# Patient Record
Sex: Male | Born: 1985 | Race: Black or African American | Hispanic: No | Marital: Single | Smoking: Current every day smoker
Health system: Southern US, Community
[De-identification: ages and names within clinical notes are randomized; demographics above are authoritative.]

---
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
## 2006-11-09 ENCOUNTER — Emergency Department (HOSPITAL_COMMUNITY): Admission: EM | Admit: 2006-11-09 | Discharge: 2006-11-09 | Payer: Self-pay | Admitting: Emergency Medicine

## 2007-06-10 ENCOUNTER — Emergency Department (HOSPITAL_COMMUNITY): Admission: EM | Admit: 2007-06-10 | Discharge: 2007-06-10 | Payer: Self-pay | Admitting: Emergency Medicine

## 2008-08-30 ENCOUNTER — Emergency Department (HOSPITAL_COMMUNITY): Admission: EM | Admit: 2008-08-30 | Discharge: 2008-08-30 | Payer: Self-pay | Admitting: Emergency Medicine

## 2009-05-20 ENCOUNTER — Emergency Department (HOSPITAL_COMMUNITY): Admission: EM | Admit: 2009-05-20 | Discharge: 2009-05-20 | Payer: Self-pay | Admitting: Emergency Medicine

## 2010-05-21 ENCOUNTER — Emergency Department (HOSPITAL_COMMUNITY): Admission: EM | Admit: 2010-05-21 | Discharge: 2010-05-21 | Payer: Self-pay | Admitting: Emergency Medicine

## 2010-07-05 ENCOUNTER — Emergency Department (HOSPITAL_COMMUNITY): Admission: EM | Admit: 2010-07-05 | Discharge: 2010-07-05 | Payer: Self-pay | Admitting: Emergency Medicine

## 2011-01-14 LAB — GLUCOSE, CAPILLARY: Glucose-Capillary: 115 mg/dL — ABNORMAL HIGH (ref 70–99)

## 2011-07-11 LAB — DIFFERENTIAL
Basophils Relative: 0
Eosinophils Absolute: 0.1
Eosinophils Relative: 2
Lymphs Abs: 1.2
Neutrophils Relative %: 65

## 2011-07-11 LAB — RPR: RPR Ser Ql: REACTIVE — AB

## 2011-07-11 LAB — CBC
Hemoglobin: 15.5
MCHC: 32.9
RBC: 5.18
WBC: 5.1

## 2011-07-11 LAB — COMPREHENSIVE METABOLIC PANEL
ALT: 21
AST: 18
Alkaline Phosphatase: 78
CO2: 28
Calcium: 9.3
Chloride: 106
GFR calc Af Amer: >60
GFR calc non Af Amer: >60
Glucose, Bld: 88
Potassium: 4.3
Sodium: 139

## 2011-07-11 LAB — RPR TITER: RPR Titer: 1:16 {titer} — AB

## 2011-07-11 LAB — URINALYSIS, ROUTINE W REFLEX MICROSCOPIC
Bilirubin Urine: NEGATIVE
Glucose, UA: NEGATIVE
Nitrite: NEGATIVE
Specific Gravity, Urine: 1.019
pH: 6

## 2011-07-11 LAB — GC/CHLAMYDIA PROBE AMP, GENITAL: Chlamydia, DNA Probe: NEGATIVE

## 2011-07-21 LAB — DIFFERENTIAL
Basophils Absolute: 0.1
Basophils Relative: 1
Eosinophils Absolute: 0
Neutrophils Relative %: 62

## 2011-07-21 LAB — CBC
HCT: 45.5
Hemoglobin: 15.3
RBC: 5.05

## 2011-07-21 LAB — COMPREHENSIVE METABOLIC PANEL
ALT: 22
Alkaline Phosphatase: 47
BUN: 5 — ABNORMAL LOW
CO2: 29
GFR calc non Af Amer: >60
Glucose, Bld: 75
Potassium: 4.3
Sodium: 142
Total Bilirubin: 1.3 — ABNORMAL HIGH

## 2011-07-21 LAB — LIPASE, BLOOD: Lipase: 23

## 2012-03-26 ENCOUNTER — Emergency Department (HOSPITAL_COMMUNITY): Payer: Self-pay

## 2012-03-26 ENCOUNTER — Emergency Department (HOSPITAL_COMMUNITY)
Admission: EM | Admit: 2012-03-26 | Discharge: 2012-03-26 | Disposition: A | Discharge status: Home / Self Care | Payer: Self-pay | Attending: Emergency Medicine | Admitting: Emergency Medicine

## 2012-03-26 ENCOUNTER — Encounter (HOSPITAL_COMMUNITY): Payer: Self-pay | Admitting: Emergency Medicine

## 2012-03-26 DIAGNOSIS — M25569 Pain in unspecified knee: Secondary | ICD-10-CM | POA: Insufficient documentation

## 2012-03-26 DIAGNOSIS — M25559 Pain in unspecified hip: Secondary | ICD-10-CM | POA: Insufficient documentation

## 2012-03-26 DIAGNOSIS — S8390XA Sprain of unspecified site of unspecified knee, initial encounter: Secondary | ICD-10-CM

## 2012-03-26 DIAGNOSIS — W19XXXA Unspecified fall, initial encounter: Secondary | ICD-10-CM | POA: Insufficient documentation

## 2012-03-26 DIAGNOSIS — Y9302 Activity, running: Secondary | ICD-10-CM | POA: Insufficient documentation

## 2012-03-26 MED ORDER — TRAMADOL HCL 50 MG PO TABS: 50.0000 mg | ORAL_TABLET | Freq: Four times a day (QID) | ORAL | Status: AC | PRN

## 2012-03-26 MED ORDER — HYDROCODONE-ACETAMINOPHEN 5-325 MG PO TABS
2.0000 | ORAL_TABLET | Freq: Once | ORAL | Status: AC
  Administered 2012-03-26: 2 via ORAL
  Filled 2012-03-26: qty 2

## 2012-03-26 MED ORDER — METHOCARBAMOL 500 MG PO TABS
500.0000 mg | ORAL_TABLET | Freq: Once | ORAL | Status: AC
  Administered 2012-03-26: 500 mg via ORAL
  Filled 2012-03-26: qty 1

## 2012-03-26 MED ORDER — NAPROXEN 500 MG PO TABS: 500.0000 mg | ORAL_TABLET | Freq: Two times a day (BID) | ORAL | Status: DC

## 2012-03-26 MED ORDER — METHOCARBAMOL 500 MG PO TABS: 500.0000 mg | ORAL_TABLET | Freq: Two times a day (BID) | ORAL | Status: AC

## 2012-03-26 NOTE — ED Provider Notes (Signed)
History     CSN: [id number]  Arrival date & time 03/26/12  1426   First MD Initiated Contact with Patient 03/26/12 1530     4:15 PM HPI Patient reports yesterday he was running when he fell. Reports he fell directly onto his right knee. States had mild pain yesterday but after going to sleep had worsening pain in the morning. Reports pain with bending and ambulation. Swelling, or discoloration. Reports the majority of his pain is located in his posterior knee. Reports also had mild numbness in his feet when waking up this morning.  Patient is a 26 y.o. male presenting with knee pain. The history is provided by the patient.  Knee Pain This is a new problem. The current episode started yesterday. The problem occurs constantly. The problem has been gradually worsening. Associated symptoms include numbness. Pertinent negatives include no chills, fever, joint swelling, nausea or weakness. The symptoms are aggravated by standing and walking. He has tried rest for the symptoms.    History reviewed. No pertinent past medical history.  History reviewed. No pertinent past surgical history.  No family history on file.  History  Substance Use Topics  . Smoking status: Not on file  . Smokeless tobacco: Not on file  . Alcohol Use:       Review of Systems  Constitutional: Negative for fever and chills.  Gastrointestinal: Negative for nausea.  Musculoskeletal: Negative for back pain and joint swelling.       Positive for knee pain  Neurological: Positive for numbness. Negative for weakness.  All other systems reviewed and are negative.    Allergies  Review of patient's allergies indicates no known allergies.  Home Medications  No current outpatient prescriptions on file.  BP 127/74  Pulse 75  Temp 98.9 F (37.2 C)  Resp 18  SpO2 100%  Physical Exam  Vitals reviewed. Constitutional: He is oriented to person, place, and time. He appears well-developed and well-nourished.  HENT:   Head: Normocephalic and atraumatic.  Eyes: Pupils are equal, round, and reactive to light.  Musculoskeletal:       Right hip: He exhibits decreased range of motion (due to pain) and tenderness. He exhibits normal strength, no bony tenderness, no swelling, no crepitus, no deformity and no laceration.       Right knee: He exhibits decreased range of motion (dus to pain). He exhibits no swelling, no effusion, no deformity, no laceration, no erythema, normal alignment, no LCL laxity, normal patellar mobility and no MCL laxity. No medial joint line and no lateral joint line tenderness noted.       Legs:      Right knee: Majority of pain is over hamstring tendon insertion and lateral ligament. Negative anterior, posterior, valgus, varus. No crepitus. No edema. Normal pulses distally and normal sensation.  Neurological: He is alert and oriented to person, place, and time.  Skin: Skin is warm and dry. No rash noted. No erythema. No pallor.  Psychiatric: He has a normal mood and affect. His behavior is normal.    ED Course  Procedures   Dg Hip Complete Right  03/26/2012  *RADIOLOGY REPORT*  Clinical Data: Larey Seat.  Right hip pain.  RIGHT HIP - COMPLETE 2+ VIEW  Comparison: None  Findings: The joint spaces are maintained.  No acute bony findings or significant degenerative changes.  No plain film evidence of avascular necrosis.  The pubic symphysis and SI joints are intact. No pelvic fractures.  IMPRESSION: No acute bony findings or  significant degenerative changes.  Original Report Authenticated By: P. Loralie Champagne, M.D.   Dg Knee Complete 4 Views Right  03/26/2012  *RADIOLOGY REPORT*  Clinical Data: History of fall complain of right-sided hip and knee pain.  RIGHT KNEE - COMPLETE 4+ VIEW  Comparison: No priors.  Findings: Four views of the right knee demonstrate no acute fracture, subluxation, dislocation, joint or soft tissue abnormality.  IMPRESSION: 1.  No acute radiographic abnormality of the right  knee.  Original Report Authenticated By: Florencia Reasons, M.D.     MDM          Thomasene Lot, PA-C 03/26/12 1732

## 2012-03-26 NOTE — Discharge Instructions (Signed)
Knee Sprain  You have a knee sprain. Sprains are painful injuries to the joints. A sprain is a partial or complete tearing of ligaments. Ligaments are tough, fibrous tissues that hold bones together at the joints. A strain (sprain) has occurred when a ligament is stretched or damaged. This injury may take several weeks to heal. This is often the same length of time as a bone fracture (break in bone) takes to heal. Even though a fracture (bone break) may not have occurred, the recovery times may be similar.  HOME CARE INSTRUCTIONS   · Rest the injured area for as long as directed by your caregiver. Then slowly start using the joint as directed by your caregiver and as the pain allows. Use crutches as directed. If the knee was splinted or casted, continue use and care as directed. If an ace bandage has been applied today, it should be removed and reapplied every 3 to 4 hours. It should not be applied tightly, but firmly enough to keep swelling down. Watch toes and feet for swelling, bluish discoloration, coldness, numbness or excessive pain. If any of these symptoms occur, remove the ace bandage and reapply more loosely.If these symptoms persist, seek medical attention.  · For the first 24 hours, lie down. Keep the injured extremity elevated on two pillows.  · Apply ice to the injured area for 15 to 20 minutes every couple hours. Repeat this 3 to 4 times per day for the first 48 hours. Put the ice in a plastic bag and place a towel between the bag of ice and your skin.  · Wear any splinting, casting, or elastic bandage applications as instructed.  · Only take over-the-counter or prescription medicines for pain, discomfort, or fever as directed by your caregiver. Do not use aspirin immediately after the injury unless instructed by your caregiver. Aspirin can cause increased bleeding and bruising of the tissues.  · If you were given crutches, continue to use them as instructed. Do not resume weight bearing on the  affected extremity until instructed.  Persistent pain and inability to use the injured area as directed for more than 2 to 3 days are warning signs. If this happens you should see a caregiver for a follow-up visit as soon as possible. Initially, a hairline fracture (this is the same as a broken bone) may not be evident on x-rays. Persistent pain and swelling indicate that further evaluation, non-weight bearing (use of crutches as instructed), and/or further x-rays are indicated. X-rays may sometimes not show a small fracture until a week or ten days later. Make a follow-up appointment with your own caregiver or one to whom we have referred you. A radiologist (specialist in reading x-rays) may re-read your X-rays. Make sure you know how you are to get your x-ray results. Do not assume everything is normal if you do not hear from us.  SEEK MEDICAL CARE IF:   · Bruising, swelling, or pain increases.  · You have cold or numb toes  · You have continuing difficulty or pain with walking.  SEEK IMMEDIATE MEDICAL CARE IF:   · Your toes are cold, numb or blue.  · The pain is not responding to medications and continues to stay the same or get worse.  MAKE SURE YOU:   · Understand these instructions.  · Will watch your condition.  · Will get help right away if you are not doing well or get worse.  Document Released: 09/25/2005 Document Revised: 09/14/2011 Document Reviewed: 09/09/2007    [employer]® Patient Information ©2012 [employer].

## 2012-03-26 NOTE — ED Notes (Signed)
PT reports that he fell while running yesterday and is having right lower leg pain. Pt ambulated with a limp to room. Reports foot feels numb.

## 2012-03-27 NOTE — ED Provider Notes (Signed)
Medical screening examination/treatment/procedure(s) were performed by non-physician practitioner and as supervising physician I was immediately available for consultation/collaboration.   Rolan Bucco, MD 03/27/12 0002

## 2012-04-26 ENCOUNTER — Encounter (HOSPITAL_COMMUNITY): Payer: Self-pay | Admitting: Emergency Medicine

## 2012-04-26 ENCOUNTER — Emergency Department (HOSPITAL_COMMUNITY)
Admission: EM | Admit: 2012-04-26 | Discharge: 2012-04-26 | Disposition: A | Discharge status: Home / Self Care | Payer: Self-pay | Attending: Emergency Medicine | Admitting: Emergency Medicine

## 2012-04-26 DIAGNOSIS — K029 Dental caries, unspecified: Secondary | ICD-10-CM | POA: Insufficient documentation

## 2012-04-26 DIAGNOSIS — K0889 Other specified disorders of teeth and supporting structures: Secondary | ICD-10-CM

## 2012-04-26 DIAGNOSIS — E119 Type 2 diabetes mellitus without complications: Secondary | ICD-10-CM | POA: Insufficient documentation

## 2012-04-26 DIAGNOSIS — F17200 Nicotine dependence, unspecified, uncomplicated: Secondary | ICD-10-CM | POA: Insufficient documentation

## 2012-04-26 MED ORDER — OXYCODONE-ACETAMINOPHEN 5-325 MG PO TABS
2.0000 | ORAL_TABLET | Freq: Once | ORAL | Status: AC
  Administered 2012-04-26: 2 via ORAL
  Filled 2012-04-26: qty 2

## 2012-04-26 MED ORDER — OXYCODONE-ACETAMINOPHEN 5-325 MG PO TABS: 1.0000 | ORAL_TABLET | Freq: Four times a day (QID) | ORAL | Status: AC | PRN

## 2012-04-26 MED ORDER — IBUPROFEN 800 MG PO TABS: 800.0000 mg | ORAL_TABLET | Freq: Three times a day (TID) | ORAL | Status: AC

## 2012-04-26 MED ORDER — PENICILLIN V POTASSIUM 500 MG PO TABS: 500.0000 mg | ORAL_TABLET | Freq: Three times a day (TID) | ORAL | Status: AC

## 2012-04-26 NOTE — ED Provider Notes (Signed)
History     CSN: [id number]  Arrival date & time 04/26/12  1251   First MD Initiated Contact with Patient 04/26/12 1355      Chief Complaint  Patient presents with  . Dental Pain    (Consider location/radiation/quality/duration/timing/severity/associated sxs/prior treatment) HPI  Patient presents to the emergency department with a dental complaint. Symptoms began 2 weeks ago. The patient has tried to alleviate pain with tylenol.  Pain rated at a 10/10, characterized as throbbing in nature and located right lower molar. Patient denies fever, night sweats, chills, difficulty swallowing or opening mouth, SOB, nuchal rigidity or decreased ROM of neck.  Patient does not have a dentist and requests a resource guide at discharge.   Past Medical History  Diagnosis Date  . Diabetes mellitus     History reviewed. No pertinent past surgical history.  History reviewed. No pertinent family history.  History  Substance Use Topics  . Smoking status: Current Everyday Smoker  . Smokeless tobacco: Not on file  . Alcohol Use:       Review of Systems    HEENT: denies blurry vision or change in hearing PULMONARY: Denies difficulty breathing and SOB CARDIAC: denies chest pain or heart palpitations MUSCULOSKELETAL:  denies being unable to ambulate ABDOMEN AL: denies abdominal pain GU: denies loss of bowel or urinary control NEURO: denies numbness and tingling in extremities SKIN: no new rashes PSYCH: patient denies anxiety or depression. NECK: Pt denies having neck pain    Allergies  Review of patient's allergies indicates no known allergies.  Home Medications   Current Outpatient Rx  Name Route Sig Dispense Refill  . ACETAMINOPHEN 500 MG PO TABS Oral Take 500 mg by mouth every 6 (six) hours as needed.    . ADULT MULTIVITAMIN W/MINERALS CH Oral Take 1 tablet by mouth daily.    . IBUPROFEN 800 MG PO TABS Oral Take 1 tablet (800 mg total) by mouth 3 (three) times daily. 21  tablet 0  . OXYCODONE-ACETAMINOPHEN 5-325 MG PO TABS Oral Take 1 tablet by mouth every 6 (six) hours as needed for pain. 15 tablet 0  . PENICILLIN V POTASSIUM 500 MG PO TABS Oral Take 1 tablet (500 mg total) by mouth 3 (three) times daily. 30 tablet 0    BP 123/92  Pulse 84  Temp 98.3 F (36.8 C) (Oral)  Resp 16  Ht 5\' 8"  (1.727 m)  Wt 190 lb (86.183 kg)  BMI 28.89 kg/m2  SpO2 99%  Physical Exam  Nursing note and vitals reviewed. Constitutional: He appears well-developed and well-nourished.  HENT:  Head: Normocephalic and atraumatic. No trismus in the jaw.  Mouth/Throat: No oral lesions. Dental caries present. No dental abscesses, uvula swelling or lacerations.    Eyes: Conjunctivae and EOM are normal. Pupils are equal, round, and reactive to light.  Neck: Normal range of motion. Neck supple.  Cardiovascular: Normal rate and regular rhythm.   Pulmonary/Chest: Effort normal and breath sounds normal.    ED Course  Procedures (including critical care time)  Labs Reviewed - No data to display No results found.   1. Toothache       MDM  Pt given Rx for Percocets 5-325 (10 tabs) and Penicillin. Patient informed that they need to find a dentist and have the tooth pulled or the symptoms may be reoccurring. A Resource guide has been given with dental providers. Patient has been given return to ED precautions.         Dorthula Matas,  PA 04/26/12 1434

## 2012-04-26 NOTE — ED Notes (Signed)
Pt c/o right lower toothache x 1 month.  Pt states he just needs something for pain and a referral to a dentist. No facial swelling noted.

## 2012-04-27 NOTE — ED Provider Notes (Signed)
Medical screening examination/treatment/procedure(s) were performed by non-physician practitioner and as supervising physician I was immediately available for consultation/collaboration.   Suzi Roots, MD 04/27/12 [phone]

## 2012-12-30 ENCOUNTER — Encounter (HOSPITAL_COMMUNITY): Payer: Self-pay | Admitting: Emergency Medicine

## 2012-12-30 ENCOUNTER — Emergency Department (HOSPITAL_COMMUNITY)
Admission: EM | Admit: 2012-12-30 | Discharge: 2012-12-30 | Disposition: A | Discharge status: Home / Self Care | Payer: Self-pay | Attending: Emergency Medicine | Admitting: Emergency Medicine

## 2012-12-30 DIAGNOSIS — E119 Type 2 diabetes mellitus without complications: Secondary | ICD-10-CM | POA: Insufficient documentation

## 2012-12-30 DIAGNOSIS — G43909 Migraine, unspecified, not intractable, without status migrainosus: Secondary | ICD-10-CM | POA: Insufficient documentation

## 2012-12-30 DIAGNOSIS — R5381 Other malaise: Secondary | ICD-10-CM | POA: Insufficient documentation

## 2012-12-30 DIAGNOSIS — F17200 Nicotine dependence, unspecified, uncomplicated: Secondary | ICD-10-CM | POA: Insufficient documentation

## 2012-12-30 DIAGNOSIS — R531 Weakness: Secondary | ICD-10-CM

## 2012-12-30 DIAGNOSIS — M545 Low back pain, unspecified: Secondary | ICD-10-CM | POA: Insufficient documentation

## 2012-12-30 LAB — BASIC METABOLIC PANEL
BUN: 8 mg/dL (ref 6–23)
CO2: 27 mEq/L (ref 19–32)
Calcium: 9.1 mg/dL (ref 8.4–10.5)
Chloride: 106 mEq/L (ref 96–112)
Creatinine, Ser: 0.71 mg/dL (ref 0.50–1.35)
GFR calc Af Amer: >90 mL/min (ref >90)
GFR calc non Af Amer: >90 mL/min (ref >90)
Glucose, Bld: 99 mg/dL (ref 70–99)
Potassium: 3.6 mEq/L (ref 3.5–5.1)
Sodium: 142 mEq/L (ref 135–145)

## 2012-12-30 LAB — URINE MICROSCOPIC-ADD ON

## 2012-12-30 LAB — CBC
HCT: 44.8 % (ref 39.0–52.0)
Hemoglobin: 15.1 g/dL (ref 13.0–17.0)
MCH: 29.3 pg (ref 26.0–34.0)
MCHC: 33.7 g/dL (ref 30.0–36.0)
MCV: 87 fL (ref 78.0–100.0)
Platelets: 242 10*3/uL (ref 150–400)
RBC: 5.15 MIL/uL (ref 4.22–5.81)
RDW: 13.2 % (ref 11.5–15.5)
WBC: 8.9 10*3/uL (ref 4.0–10.5)

## 2012-12-30 LAB — URINALYSIS, ROUTINE W REFLEX MICROSCOPIC
Bilirubin Urine: NEGATIVE
Glucose, UA: NEGATIVE mg/dL
Ketones, ur: NEGATIVE mg/dL
Leukocytes, UA: NEGATIVE
Nitrite: NEGATIVE
Protein, ur: NEGATIVE mg/dL
Specific Gravity, Urine: 1.01 (ref 1.005–1.030)
Urobilinogen, UA: 1 mg/dL (ref 0.0–1.0)
pH: 7 (ref 5.0–8.0)

## 2012-12-30 MED ORDER — OXYCODONE-ACETAMINOPHEN 5-325 MG PO TABS
1.0000 | ORAL_TABLET | Freq: Once | ORAL | Status: AC
  Administered 2012-12-30: 1 via ORAL
  Filled 2012-12-30: qty 1

## 2012-12-30 MED ORDER — SODIUM CHLORIDE 0.9 % IV BOLUS (SEPSIS)
1000.0000 mL | Freq: Once | INTRAVENOUS | Status: AC
  Administered 2012-12-30: 1000 mL via INTRAVENOUS

## 2012-12-30 MED ORDER — IBUPROFEN 200 MG PO TABS
600.0000 mg | ORAL_TABLET | Freq: Once | ORAL | Status: AC
  Administered 2012-12-30: 600 mg via ORAL
  Filled 2012-12-30: qty 3

## 2012-12-30 NOTE — ED Notes (Signed)
Pt c/o dizziness that has been going on for several weeks now as well as left lower back pain that has been going on for several weeks too.  Pt states that works third shift and was at work got so dizzy lost balance and fell. Doesn't complain of any injuring anything at this time.  Pt denies Pmh kidney stones, blood or trouble urinating.  Pt is diabetic and hasnt been checking blood sugar levels or taken the metformin in years bc thought by loosing weight would help.

## 2012-12-31 NOTE — ED Provider Notes (Signed)
History    27 year old male with generalized weakness. Gradual onset approximately 2 weeks ago. Constant. Relatively stable. Patient reports that he recently started working third shift at work about 3 weeks ago. Denies any acute complaints otherwise. Denies any pain anywhere. Reports past history of diabetes previously on metformin and has been noncompliant and not hours. Fevers or chills. No urinary complaints. There is no known history of any thyroid dysfunction.  CSN: [id number]  Arrival date & time 12/30/12  4098   First MD Initiated Contact with Patient 12/30/12 1001      Chief Complaint  Patient presents with  . Dizziness  . Back Pain  . Migraine    (Consider location/radiation/quality/duration/timing/severity/associated sxs/prior treatment) HPI  Past Medical History  Diagnosis Date  . Diabetes mellitus     History reviewed. No pertinent past surgical history.  No family history on file.  History  Substance Use Topics  . Smoking status: Current Every Day Smoker    Types: Cigarettes  . Smokeless tobacco: Not on file  . Alcohol Use: Yes     Comment: socially      Review of Systems  All systems reviewed and negative, other than as noted in HPI.   Allergies  Review of patient's allergies indicates no known allergies.  Home Medications   Current Outpatient Rx  Name  Route  Sig  Dispense  Refill  . acetaminophen (TYLENOL) 500 MG tablet   Oral   Take 500 mg by mouth every 6 (six) hours as needed.         . Acetaminophen-Aspirin Buffered (EXCEDRIN BACK & BODY) 250-250 MG tablet   Oral   Take 1 tablet by mouth every 4 (four) hours as needed for pain.           BP 124/62  Pulse 71  Temp(Src) 99.1 F (37.3 C) (Oral)  Resp 19  SpO2 100%  Physical Exam  Nursing note and vitals reviewed. Constitutional: He appears well-developed and well-nourished. No distress.  Transgender. Sitting in bed. NAD.   HENT:  Head: Normocephalic and atraumatic.   Eyes: Conjunctivae are normal. Right eye exhibits no discharge. Left eye exhibits no discharge.  Neck: Neck supple.  Cardiovascular: Normal rate, regular rhythm and normal heart sounds.  Exam reveals no gallop and no friction rub.   No murmur heard. Pulmonary/Chest: Effort normal and breath sounds normal. No respiratory distress.  Abdominal: Soft. He exhibits no distension. There is no tenderness.  Musculoskeletal: He exhibits no edema and no tenderness.  Neurological: He is alert.  Skin: Skin is warm and dry. He is not diaphoretic.  Psychiatric: He has a normal mood and affect. His behavior is normal. Thought content normal.    ED Course  Procedures (including critical care time)  Labs Reviewed  URINALYSIS, ROUTINE W REFLEX MICROSCOPIC - Abnormal; Notable for the following:    Hgb urine dipstick TRACE (*)    All other components within normal limits  CBC  BASIC METABOLIC PANEL  URINE MICROSCOPIC-ADD ON   No results found.   1. Generalized weakness       MDM  27 year old male with generalized weakness. Suspect that his symptoms may be related to recently starting working third shift. Does not appear to be emergent etiology. Electrolytes okay. Not anemic. Reports hx of diabetes but non fasting glucose ok on BMP.         Raeford Razor, MD 12/31/12 [phone]

## 2013-06-12 ENCOUNTER — Emergency Department (HOSPITAL_COMMUNITY)
Admission: EM | Admit: 2013-06-12 | Discharge: 2013-06-12 | Disposition: A | Discharge status: Home / Self Care | Payer: Self-pay | Attending: Emergency Medicine | Admitting: Emergency Medicine

## 2013-06-12 ENCOUNTER — Encounter (HOSPITAL_COMMUNITY): Payer: Self-pay

## 2013-06-12 ENCOUNTER — Emergency Department (HOSPITAL_COMMUNITY): Payer: Self-pay

## 2013-06-12 DIAGNOSIS — E119 Type 2 diabetes mellitus without complications: Secondary | ICD-10-CM | POA: Insufficient documentation

## 2013-06-12 DIAGNOSIS — R079 Chest pain, unspecified: Secondary | ICD-10-CM | POA: Insufficient documentation

## 2013-06-12 DIAGNOSIS — F17200 Nicotine dependence, unspecified, uncomplicated: Secondary | ICD-10-CM | POA: Insufficient documentation

## 2013-06-12 DIAGNOSIS — M7918 Myalgia, other site: Secondary | ICD-10-CM

## 2013-06-12 DIAGNOSIS — M542 Cervicalgia: Secondary | ICD-10-CM | POA: Insufficient documentation

## 2013-06-12 DIAGNOSIS — IMO0001 Reserved for inherently not codable concepts without codable children: Secondary | ICD-10-CM | POA: Insufficient documentation

## 2013-06-12 MED ORDER — METHOCARBAMOL 500 MG PO TABS: 1000.0000 mg | ORAL_TABLET | Freq: Four times a day (QID) | ORAL | Status: DC

## 2013-06-12 MED ORDER — NAPROXEN 500 MG PO TABS: 500.0000 mg | ORAL_TABLET | Freq: Two times a day (BID) | ORAL | Status: DC

## 2013-06-12 NOTE — ED Notes (Addendum)
Pt states pain in back and around neck since yesterday.  Has taken pain meds with no relief.  Congestion noted.  Pt states he has felt warm.  States pain is generalized and started with hips last week.  Pt also states he has physical job.

## 2013-06-12 NOTE — ED Provider Notes (Signed)
Medical screening examination/treatment/procedure(s) were performed by non-physician practitioner and as supervising physician I was immediately available for consultation/collaboration.   Gertrude Bucks T Azizah Lisle, MD 06/12/13 1532 

## 2013-06-12 NOTE — ED Provider Notes (Signed)
CSN: [id number]     Arrival date & time 06/12/13  0307 History   First MD Initiated Contact with Patient 06/12/13 [phone]     Chief Complaint  Patient presents with  . Back Pain   (Consider location/radiation/quality/duration/timing/severity/associated sxs/prior Treatment) HPI Comments: Patient presents with several days of posterior neck and anterior chest pain that is worse with movement and palpation. Patient had to leave work tonight because the pain was worse. Patient has used "back and body" medication without relief. No injury but the patient does do heavy lifting at work. Patient endorses shortness of breath and decreased exercise tolerance. No fever or neck stiffness. No chest pain. No abdominal pain or urinary symptoms. No weakness in arms or legs. Patient has had bilateral upper extremity paresthesias. Patient has never had this pain before. Onset of symptoms gradual. Course is constant. Nothing makes symptoms better.  Patient is a 27 y.o. male presenting with back pain. The history is provided by the patient.  Back Pain Associated symptoms: chest pain   Associated symptoms: no abdominal pain, no dysuria and no fever     Past Medical History  Diagnosis Date  . Diabetes mellitus    History reviewed. No pertinent past surgical history. History reviewed. No pertinent family history. History  Substance Use Topics  . Smoking status: Current Every Day Smoker    Types: Cigarettes  . Smokeless tobacco: Not on file  . Alcohol Use: Yes     Comment: socially    Review of Systems  Constitutional: Negative for fever and diaphoresis.  HENT: Positive for neck pain.   Eyes: Negative for redness.  Respiratory: Negative for cough and shortness of breath.   Cardiovascular: Positive for chest pain. Negative for palpitations and leg swelling.  Gastrointestinal: Negative for nausea, vomiting and abdominal pain.  Genitourinary: Negative for dysuria.  Musculoskeletal: Positive for back pain.   Skin: Negative for rash.  Neurological: Negative for syncope and light-headedness.    Allergies  Review of patient's allergies indicates no known allergies.  Home Medications   Current Outpatient Rx  Name  Route  Sig  Dispense  Refill  . Acetaminophen-Aspirin Buffered (EXCEDRIN BACK & BODY) 250-250 MG tablet   Oral   Take 1 tablet by mouth every 4 (four) hours as needed for pain.          BP 141/85  Pulse 73  Temp(Src) 98.1 F (36.7 C) (Oral)  Resp 20  Ht 5\' 8"  (1.727 m)  Wt 233 lb (105.688 kg)  BMI 35.44 kg/m2  SpO2 98% Physical Exam  Nursing note and vitals reviewed. Constitutional: He appears well-developed and well-nourished.  HENT:  Head: Normocephalic and atraumatic.  Mouth/Throat: Mucous membranes are normal. Mucous membranes are not dry.  Eyes: Conjunctivae are normal.  Neck: Trachea normal and normal range of motion. Neck supple. Normal carotid pulses and no JVD present. No muscular tenderness present. Carotid bruit is not present. No tracheal deviation present.  Paraspinous tenderness.   Cardiovascular: Normal rate, regular rhythm, S1 normal, S2 normal, normal heart sounds and intact distal pulses.  Exam reveals no distant heart sounds and no decreased pulses.   No murmur heard. Pulmonary/Chest: Effort normal and breath sounds normal. No respiratory distress. He has no wheezes. He exhibits tenderness.  Abdominal: Soft. Normal aorta and bowel sounds are normal. There is no tenderness. There is no rebound and no guarding.  Musculoskeletal: He exhibits no edema.  Neurological: He is alert.  Skin: Skin is warm and dry. He is not  diaphoretic. No cyanosis. No pallor.  Psychiatric: He has a normal mood and affect.    ED Course  Procedures (including critical care time) Labs Review Labs Reviewed  GLUCOSE, CAPILLARY - Abnormal; Notable for the following:    Glucose-Capillary 115 (*)    All other components within normal limits   Imaging Review Dg Chest 2  View  06/12/2013   *RADIOLOGY REPORT*  Clinical Data: Shortness of breath and upper chest pain.  CHEST - 2 VIEW  Comparison: 06/10/2007.  Findings: No significant osseous abnormality.  Lungs are clear. No effusion or pneumothorax.  Cardiomediastinal size and contour are within normal limits.  The upper abdomen is unremarkable.  IMPRESSION: No evidence of acute cardiopulmonary disease.   Original Report Authenticated By: Tiburcio Pea    4:25 AM Patient seen and examined. Work-up initiated. Medications ordered.   Vital signs reviewed and are as follows: Filed Vitals:   06/12/13 0313  BP: 141/85  Pulse: 73  Temp: 98.1 F (36.7 C)  Resp: 20   Pt informed of results. Pain seems musculoskeletal. D/c home with NSAID, muscle relaxer, conservative care.   Patient urged to return with worsening symptoms or other concerns. Patient verbalized understanding and agrees with plan.   MDM   1. Musculoskeletal pain    Patient with reproducible pain of neck, upper back, and upper anterior chest to palpation and movement. Chest x-ray performed to do patient complaint of shortness of breath which is negative. Pt is PERC neg and do not suspect PE. Treat conservatively.     Renne Crigler, PA-C 06/12/13 [phone]

## 2013-08-15 ENCOUNTER — Emergency Department (HOSPITAL_COMMUNITY)
Admission: EM | Admit: 2013-08-15 | Discharge: 2013-08-15 | Disposition: A | Discharge status: Home / Self Care | Payer: Self-pay | Attending: Emergency Medicine | Admitting: Emergency Medicine

## 2013-08-15 ENCOUNTER — Emergency Department (HOSPITAL_COMMUNITY): Payer: Self-pay

## 2013-08-15 ENCOUNTER — Encounter (HOSPITAL_COMMUNITY): Payer: Self-pay | Admitting: Emergency Medicine

## 2013-08-15 DIAGNOSIS — Z792 Long term (current) use of antibiotics: Secondary | ICD-10-CM | POA: Insufficient documentation

## 2013-08-15 DIAGNOSIS — J40 Bronchitis, not specified as acute or chronic: Secondary | ICD-10-CM

## 2013-08-15 DIAGNOSIS — J069 Acute upper respiratory infection, unspecified: Secondary | ICD-10-CM | POA: Insufficient documentation

## 2013-08-15 DIAGNOSIS — H9209 Otalgia, unspecified ear: Secondary | ICD-10-CM | POA: Insufficient documentation

## 2013-08-15 DIAGNOSIS — J209 Acute bronchitis, unspecified: Secondary | ICD-10-CM | POA: Insufficient documentation

## 2013-08-15 DIAGNOSIS — E119 Type 2 diabetes mellitus without complications: Secondary | ICD-10-CM | POA: Insufficient documentation

## 2013-08-15 DIAGNOSIS — J329 Chronic sinusitis, unspecified: Secondary | ICD-10-CM

## 2013-08-15 DIAGNOSIS — F17200 Nicotine dependence, unspecified, uncomplicated: Secondary | ICD-10-CM | POA: Insufficient documentation

## 2013-08-15 DIAGNOSIS — R1110 Vomiting, unspecified: Secondary | ICD-10-CM | POA: Insufficient documentation

## 2013-08-15 MED ORDER — ALBUTEROL SULFATE HFA 108 (90 BASE) MCG/ACT IN AERS
2.0000 | INHALATION_SPRAY | Freq: Once | RESPIRATORY_TRACT | Status: AC
  Administered 2013-08-15: 2 via RESPIRATORY_TRACT
  Filled 2013-08-15: qty 6.7

## 2013-08-15 MED ORDER — AMOXICILLIN-POT CLAVULANATE 875-125 MG PO TABS: 1.0000 | ORAL_TABLET | Freq: Two times a day (BID) | ORAL | Status: DC

## 2013-08-15 NOTE — Progress Notes (Signed)
[facility] did not get to see patient but will be sending information about the [facility] [employer] Card program, using the address provided.  °

## 2013-08-15 NOTE — ED Notes (Signed)
Pt states for the past week has had congestion, yellow/greenish mucus, shortness of breath, coughing, headache, states has been taking OTC medications w/ no relief.

## 2013-08-15 NOTE — ED Provider Notes (Signed)
CSN: [id number]     Arrival date & time 08/15/13  1216 History  This chart was scribed for non-physician practitioner Raymon Mutton, PA-C working with Toy Baker, MD by Joaquin Music, ED Scribe. This patient was seen in room WTR6/WTR6 and the patient's care was started at 2:06 PM .     Chief Complaint  Patient presents with  . Nasal Congestion  . Shortness of Breath  . Cough   The history is provided by the patient. No language interpreter was used.   HPI Comments: Timothy Levy is a 27 y.o. male who presents to the Emergency Department complaining of ongoing worsening chest tightness, SOB, cough with associated nasal congestion and chills onset 1 week. Pt states he has been waking up feeling worse and feeling clammy. Pt states chest tightness began 3 days ago. Pt states he has rib pain with deep breaths. Pt states pain feels like it "raps around his sides to his back". Pt denies pain that radiates.  Pt also complains of productive cough with greenish yellowish sputum. Pt states he has had a HA and otalgia for a week with sinus pressure. Pt is a smoker. He denies wheezing. Pt states he has been taking OTC Tylenol. Pt states he has had a few episodes of emesis PTA and last night. He states the emesis was mainly mucus and phlegm. He reports a loss of appetite. Pt reports having normal bowel movements. Pt denies neck pain, neck stiffness, sore throat, difficulty swallowing, and abd pain, travel.  Past Medical History  Diagnosis Date  . Diabetes mellitus    History reviewed. No pertinent past surgical history. No family history on file. History  Substance Use Topics  . Smoking status: Current Every Day Smoker    Types: Cigarettes  . Smokeless tobacco: Not on file  . Alcohol Use: Yes     Comment: socially    Review of Systems  Constitutional: Positive for chills and appetite change.  HENT: Positive for congestion, ear pain and sinus pressure. Negative for sore  throat.   Respiratory: Positive for cough and shortness of breath.   Gastrointestinal: Positive for vomiting. Negative for nausea and diarrhea.  Musculoskeletal: Positive for myalgias.  Neurological: Positive for headaches. Negative for weakness and numbness.  All other systems reviewed and are negative.    Allergies  Review of patient's allergies indicates no known allergies.  Home Medications   Current Outpatient Rx  Name  Route  Sig  Dispense  Refill  . pseudoephedrine-acetaminophen (TYLENOL SINUS) 30-500 MG TABS   Oral   Take 1 tablet by mouth every 4 (four) hours as needed (congestion).         . sodium-potassium bicarbonate (ALKA-SELTZER GOLD) TBEF dissolvable tablet   Oral   Take 1 tablet by mouth daily as needed (cold).         Marland Kitchen amoxicillin-clavulanate (AUGMENTIN) 875-125 MG per tablet   Oral   Take 1 tablet by mouth 2 (two) times daily.   14 tablet   0    Triage Vitals:BP 131/104  Pulse 93  Temp(Src) 97.9 F (36.6 C) (Oral)  Resp 20  Ht 5\' 8"  (1.727 m)  Wt 215 lb (97.523 kg)  BMI 32.70 kg/m2  SpO2 98%  Physical Exam  Nursing note and vitals reviewed. Constitutional: He is oriented to person, place, and time. He appears well-developed and well-nourished. No distress.  HENT:  Head: Normocephalic and atraumatic.  Right Ear: External ear normal.  Left Ear: External ear normal.  Mouth/Throat: Oropharynx is clear and moist. No oropharyngeal exudate.  Decrease nasal patency Discomfort upon palpation to the maxillary frontal sinuses  Negative swelling, erythema, inflammation, exudate noted to the tonsils and posterior oropharynx. Uvula midline, such elevation.  Eyes: Conjunctivae and EOM are normal. Pupils are equal, round, and reactive to light. Right eye exhibits no discharge. Left eye exhibits no discharge.  Neck: Normal range of motion. Neck supple.  Negative neck stiffness Negative nuchal rigidity Negative cervical lymphadenopathy Negative  meningeal signs  Cardiovascular: Normal rate, regular rhythm and normal heart sounds.  Exam reveals no friction rub.   Pulmonary/Chest: Effort normal and breath sounds normal. No respiratory distress. He has no wheezes. He has no rales. He exhibits tenderness.  Discomfort upon palpation to the chest wall  Lymphadenopathy:    He has no cervical adenopathy.  Neurological: He is alert and oriented to person, place, and time. He exhibits normal muscle tone. Coordination normal.  Skin: Skin is warm and dry. No rash noted. He is not diaphoretic. No erythema.  Psychiatric: He has a normal mood and affect. His behavior is normal. Thought content normal.    ED Course  Procedures  DIAGNOSTIC STUDIES: Oxygen Saturation is 98% on RA, normal by my interpretation.    COORDINATION OF CARE: 2:19 PM-Discussed treatment plan which includes D/C pt with medication. Pt agreed to plan.   Dg Chest 2 View  08/15/2013   CLINICAL DATA:  Nasal congestion with cough.  EXAM: CHEST  2 VIEW  COMPARISON:  06/12/2013  FINDINGS: The lungs are clear without focal infiltrate, edema, pneumothorax or pleural effusion. The cardiopericardial silhouette is within normal limits for size. Imaged bony structures of the thorax are intact.  IMPRESSION: Normal exam.   Electronically Signed   By: Kennith Center M.D.   On: 08/15/2013 13:05   Labs Review Labs Reviewed - No data to display Imaging Review No results found.  EKG Interpretation   None       MDM   1. Sinusitis   2. URI (upper respiratory infection)   3. Bronchitis    Medications  albuterol (PROVENTIL HFA;VENTOLIN HFA) 108 (90 BASE) MCG/ACT inhaler 2 puff (2 puffs Inhalation Given 08/15/13 1440)    Filed Vitals:   08/15/13 1226  BP: 131/104  Pulse: 93  Temp: 97.9 F (36.6 C)  TempSrc: Oral  Resp: 20  Height: 5\' 8"  (1.727 m)  Weight: 215 lb (97.523 kg)  SpO2: 98%   I personally performed the services described in this documentation, which was scribed in  my presence. The recorded information has been reviewed and is accurate.  Patient presenting to emergency department with subjective fevers, cough, nasal congestion, chest tightness and shortness of breath the been ongoing for the past week. Patient currently smokes cigarettes daily. Patient has been taking Tylenol minimal relief. Alert and oriented. Discomfort upon palpation to the maxillary frontal sinuses. Full range of motion to upper lower extremities bilaterally. Lungs clear to auscultation bilaterally. Discomfort upon palpation to the chest wall-costochondritis. Heart rate and rhythm normal-radial pulses palpable and strong. Negative neck stiffness, negative nuchal rigidity-negative meningeal signs. Decrease nasal patency bilaterally. Uvula midline, symmetrical elevation. Negative petechiae, erythema, swelling, exudate localizes tonsils and posterior oropharynx. Doubt cardiac issue. Doubt pneumonia. Doubt streptococcal pharyngitis. Doubt mono. Doubt peritonsillar abscess. Suspicion to be sinus infection, upper respiratory infection, bronchitis secondary to smoking. Patient stable, afebrile. Discharge patient with albuterol inhaler and antibiotics. [facility]. Discussed with patient to rest and stay hydrated.  Discussed with patient to continue to monitor symptoms if symptoms are to worsen or change report back to emergency department - strict return instructions given. Patient agreed to plan of care, understood, all questions answered.   Raymon Mutton, PA-C 08/17/13 [address], PA-C 08/17/13 1401

## 2013-08-17 NOTE — ED Provider Notes (Signed)
Medical screening examination/treatment/procedure(s) were performed by non-physician practitioner and as supervising physician I was immediately available for consultation/collaboration.  EKG Interpretation   None        Toy Baker, MD 08/17/13 1437

## 2014-04-15 ENCOUNTER — Emergency Department (HOSPITAL_COMMUNITY)
Admission: EM | Admit: 2014-04-15 | Discharge: 2014-04-15 | Discharge status: Against Medical Advice | Payer: Self-pay | Attending: Emergency Medicine | Admitting: Emergency Medicine

## 2014-04-15 ENCOUNTER — Encounter (HOSPITAL_COMMUNITY): Payer: Self-pay | Admitting: Emergency Medicine

## 2014-04-15 DIAGNOSIS — J029 Acute pharyngitis, unspecified: Secondary | ICD-10-CM | POA: Insufficient documentation

## 2014-04-15 DIAGNOSIS — F17200 Nicotine dependence, unspecified, uncomplicated: Secondary | ICD-10-CM | POA: Insufficient documentation

## 2014-04-15 DIAGNOSIS — E119 Type 2 diabetes mellitus without complications: Secondary | ICD-10-CM | POA: Insufficient documentation

## 2014-04-15 NOTE — ED Notes (Signed)
Pt left AMA, stating he "needs to go to work." VF CorporationPA-C notified.

## 2014-04-15 NOTE — ED Provider Notes (Signed)
Medical screening examination/treatment/procedure(s) were performed by non-physician practitioner and as supervising physician I was immediately available for consultation/collaboration.   EKG Interpretation None       Juliet RudeNathan R. Rubin PayorPickering, MD 04/15/14 [phone]

## 2014-04-15 NOTE — ED Notes (Signed)
Pt c/o increasing R ear pain and sore throat x 1 month.  Pain score 10/10.  Pt reports that pain started in ear, moved to throat, and now, he feels like it's causing him to gag.

## 2014-04-15 NOTE — ED Provider Notes (Signed)
The patient was seen by my student and I went to see the patient 10 mins after the student and the patient had left. I did not get to see the patient. Patient had to go to work and left AMA  Timothy DollyChristopher W Emmanuelle Coxe, PA-C 04/15/14 1840

## 2014-04-19 ENCOUNTER — Emergency Department (INDEPENDENT_AMBULATORY_CARE_PROVIDER_SITE_OTHER)
Admission: EM | Admit: 2014-04-19 | Discharge: 2014-04-19 | Disposition: A | Discharge status: Home / Self Care | Payer: Self-pay | Source: Home / Self Care | Attending: Family Medicine | Admitting: Family Medicine

## 2014-04-19 ENCOUNTER — Encounter (HOSPITAL_COMMUNITY): Payer: Self-pay | Admitting: Emergency Medicine

## 2014-04-19 DIAGNOSIS — J0101 Acute recurrent maxillary sinusitis: Secondary | ICD-10-CM

## 2014-04-19 DIAGNOSIS — J020 Streptococcal pharyngitis: Secondary | ICD-10-CM

## 2014-04-19 DIAGNOSIS — J0100 Acute maxillary sinusitis, unspecified: Secondary | ICD-10-CM

## 2014-04-19 LAB — POCT RAPID STREP A: STREPTOCOCCUS, GROUP A SCREEN (DIRECT): POSITIVE — AB

## 2014-04-19 MED ORDER — IPRATROPIUM BROMIDE 0.06 % NA SOLN: 2.0000 | Freq: Four times a day (QID) | NASAL | Status: DC

## 2014-04-19 MED ORDER — GUAIFENESIN-CODEINE 100-10 MG/5ML PO SYRP: 10.0000 mL | ORAL_SOLUTION | Freq: Four times a day (QID) | ORAL | Status: DC | PRN

## 2014-04-19 MED ORDER — MINOCYCLINE HCL 100 MG PO CAPS: 100.0000 mg | ORAL_CAPSULE | Freq: Two times a day (BID) | ORAL | Status: DC

## 2014-04-19 NOTE — ED Notes (Signed)
Started with right earache approx 2 wks ago.  Over past few days has feeling of "knot" inside right side of throat with severe pain.  Continues with right earache, right-sided HA, nasal congestion.  Initially took Benadryl; has been taking some IBU.

## 2014-04-19 NOTE — ED Provider Notes (Signed)
CSN: [id number]     Arrival date & time 04/19/14  1008 History   First MD Initiated Contact with Patient 04/19/14 1032     Chief Complaint  Patient presents with  . Sore Throat  . Otalgia   (Consider location/radiation/quality/duration/timing/severity/associated sxs/prior Treatment) Patient is a 28 y.o. male presenting with pharyngitis. The history is provided by the patient.  Sore Throat This is a new problem. The current episode started more than 1 week ago. The problem has been gradually worsening. Associated symptoms include headaches. Pertinent negatives include no chest pain and no abdominal pain. The symptoms are aggravated by swallowing.    Past Medical History  Diagnosis Date  . Diabetes mellitus     as a teen took oral hypogylcemics; lost significant weight & no longer checks CBGs   History reviewed. No pertinent past surgical history. No family history on file. History  Substance Use Topics  . Smoking status: Current Every Day Smoker    Types: Cigarettes  . Smokeless tobacco: Not on file  . Alcohol Use: No    Review of Systems  Constitutional: Negative.   HENT: Positive for congestion, ear pain, rhinorrhea and sinus pressure.   Respiratory: Positive for cough.   Cardiovascular: Negative.  Negative for chest pain.  Gastrointestinal: Negative.  Negative for abdominal pain.  Neurological: Positive for headaches.    Allergies  Review of patient's allergies indicates no known allergies.  Home Medications   Prior to Admission medications   Medication Sig Start Date End Date Taking? Authorizing Provider  Cyanocobalamin (VITAMIN B 12 PO) Take 1 tablet by mouth daily as needed (energy).    Historical Provider, MD  guaiFENesin-codeine (ROBITUSSIN AC) 100-10 MG/5ML syrup Take 10 mLs by mouth 4 (four) times daily as needed for cough. 04/19/14   Linna HoffJames D Arminta Gamm, MD  ipratropium (ATROVENT) 0.06 % nasal spray Place 2 sprays into the nose 4 (four) times daily. 04/19/14   Linna HoffJames D  Lakya Schrupp, MD  minocycline (MINOCIN,DYNACIN) 100 MG capsule Take 1 capsule (100 mg total) by mouth 2 (two) times daily. 04/19/14   Linna HoffJames D Aeneas Longsworth, MD   BP 127/83  Pulse 72  Temp(Src) 99.2 F (37.3 C) (Oral)  Resp 18  SpO2 99% Physical Exam  Nursing note and vitals reviewed. Constitutional: He is oriented to person, place, and time. He appears well-developed and well-nourished.  HENT:  Head: Normocephalic.  Right Ear: External ear normal.  Nose: Mucosal edema and rhinorrhea present.  Mouth/Throat: Oropharynx is clear and moist.  Neck: Normal range of motion. Neck supple.  Cardiovascular: Normal heart sounds.   Pulmonary/Chest: Breath sounds normal.  Lymphadenopathy:    He has cervical adenopathy.  Neurological: He is alert and oriented to person, place, and time.  Skin: Skin is warm and dry.    ED Course  Procedures (including critical care time) Labs Review Labs Reviewed  POCT RAPID STREP A ([facility] ONLY) - Abnormal; Notable for the following:    Streptococcus, Group A Screen (Direct) POSITIVE (*)    All other components within normal limits    Imaging Review No results found.   MDM   1. Acute recurrent maxillary sinusitis        Linna HoffJames D Jericho Alcorn, MD 04/19/14 1101

## 2014-04-19 NOTE — Discharge Instructions (Signed)
Drink plenty of fluids as discussed, use medicine as prescribed, and mucinex or delsym for cough. Return or see your doctor if further problems °

## 2015-04-11 ENCOUNTER — Emergency Department (HOSPITAL_COMMUNITY)
Admission: EM | Admit: 2015-04-11 | Discharge: 2015-04-11 | Discharge status: Absent without leave | Payer: Self-pay | Source: Home / Self Care

## 2015-04-11 NOTE — ED Notes (Signed)
Called x1; NA 

## 2015-04-11 NOTE — ED Notes (Signed)
Call x2... NA

## 2015-04-11 NOTE — ED Notes (Signed)
Called x3... NA

## 2015-08-11 IMAGING — CR DG CHEST 2V
2 series · 2 of 2 positions shown · non-contrast
Comparison: 06/10/2007.

CLINICAL DATA: Shortness of breath and upper chest pain.

CHEST - 2 VIEW

[w chest pa]
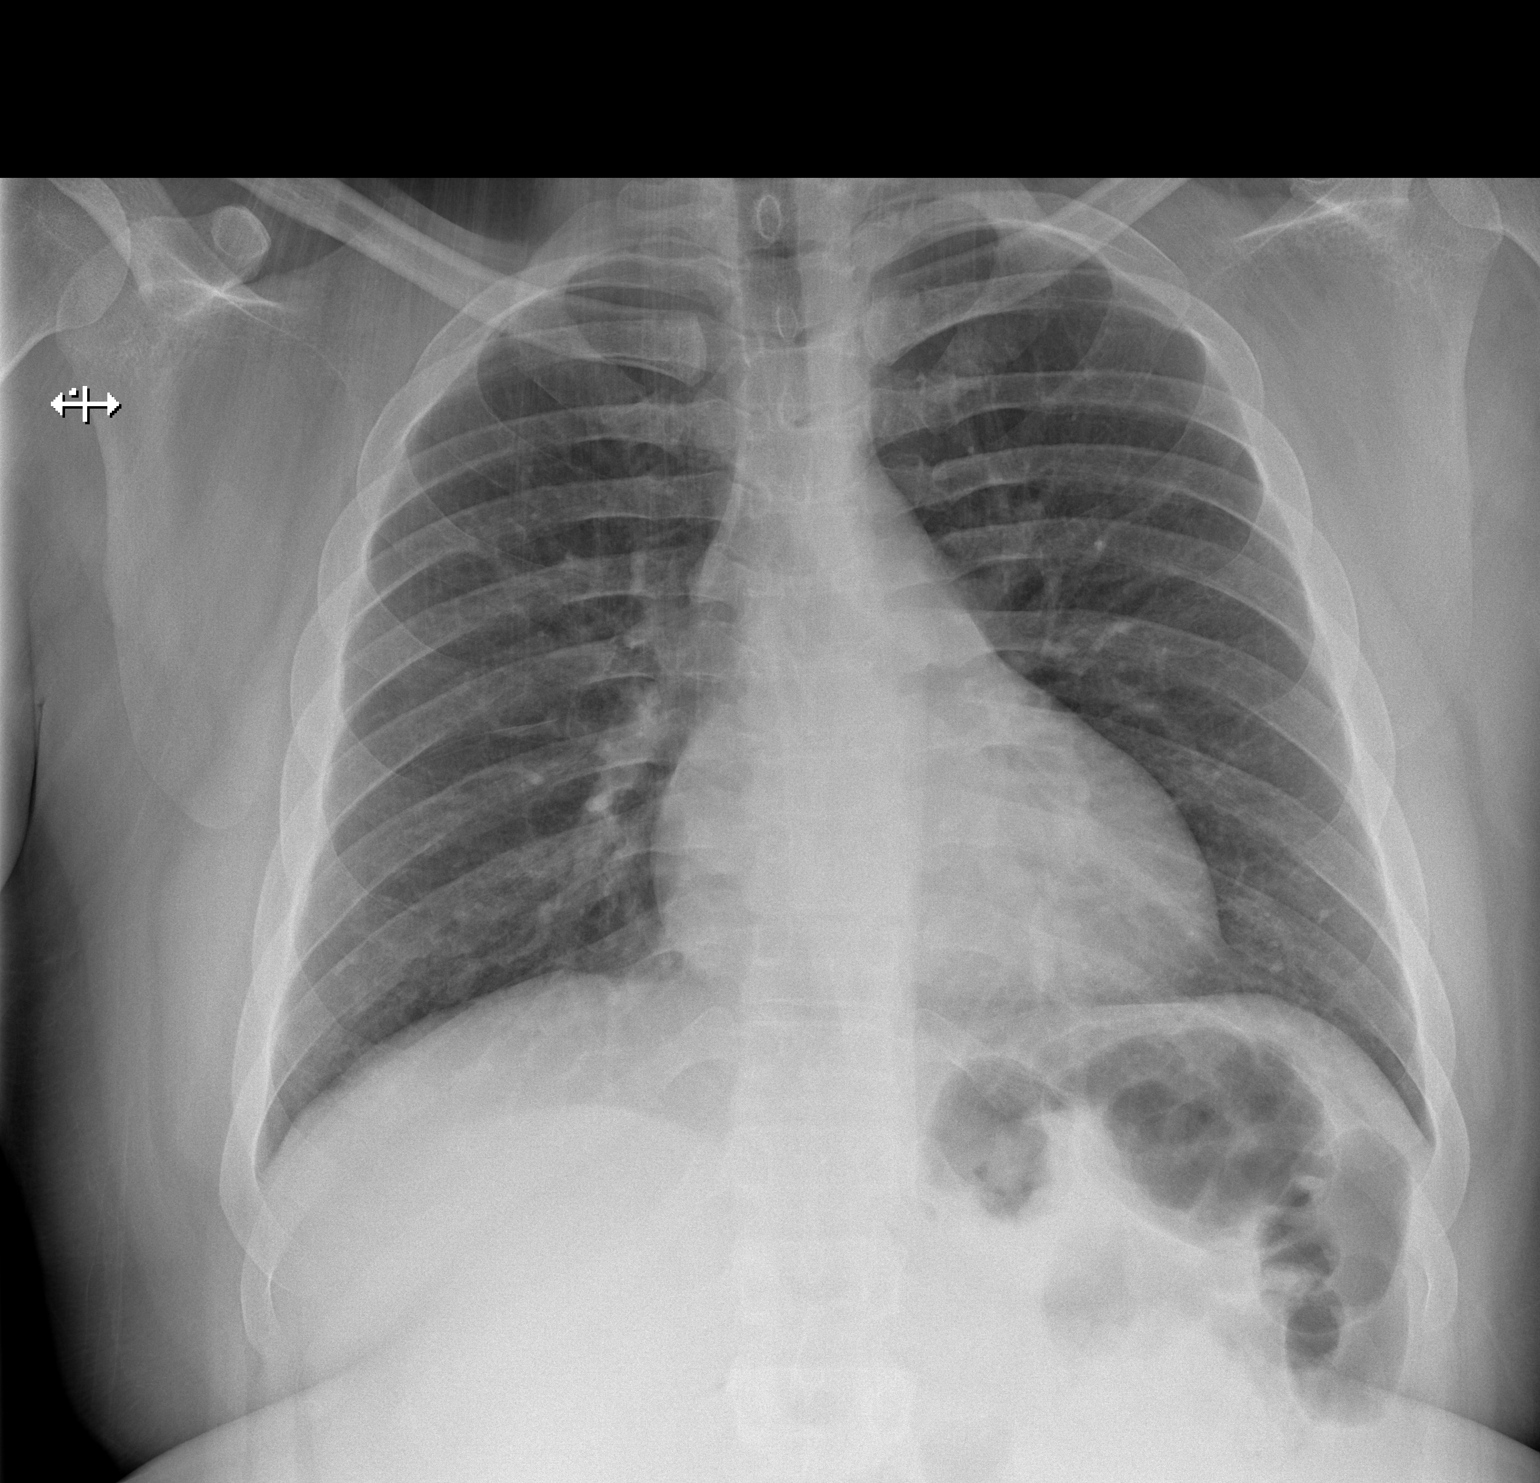

[w chest lat]
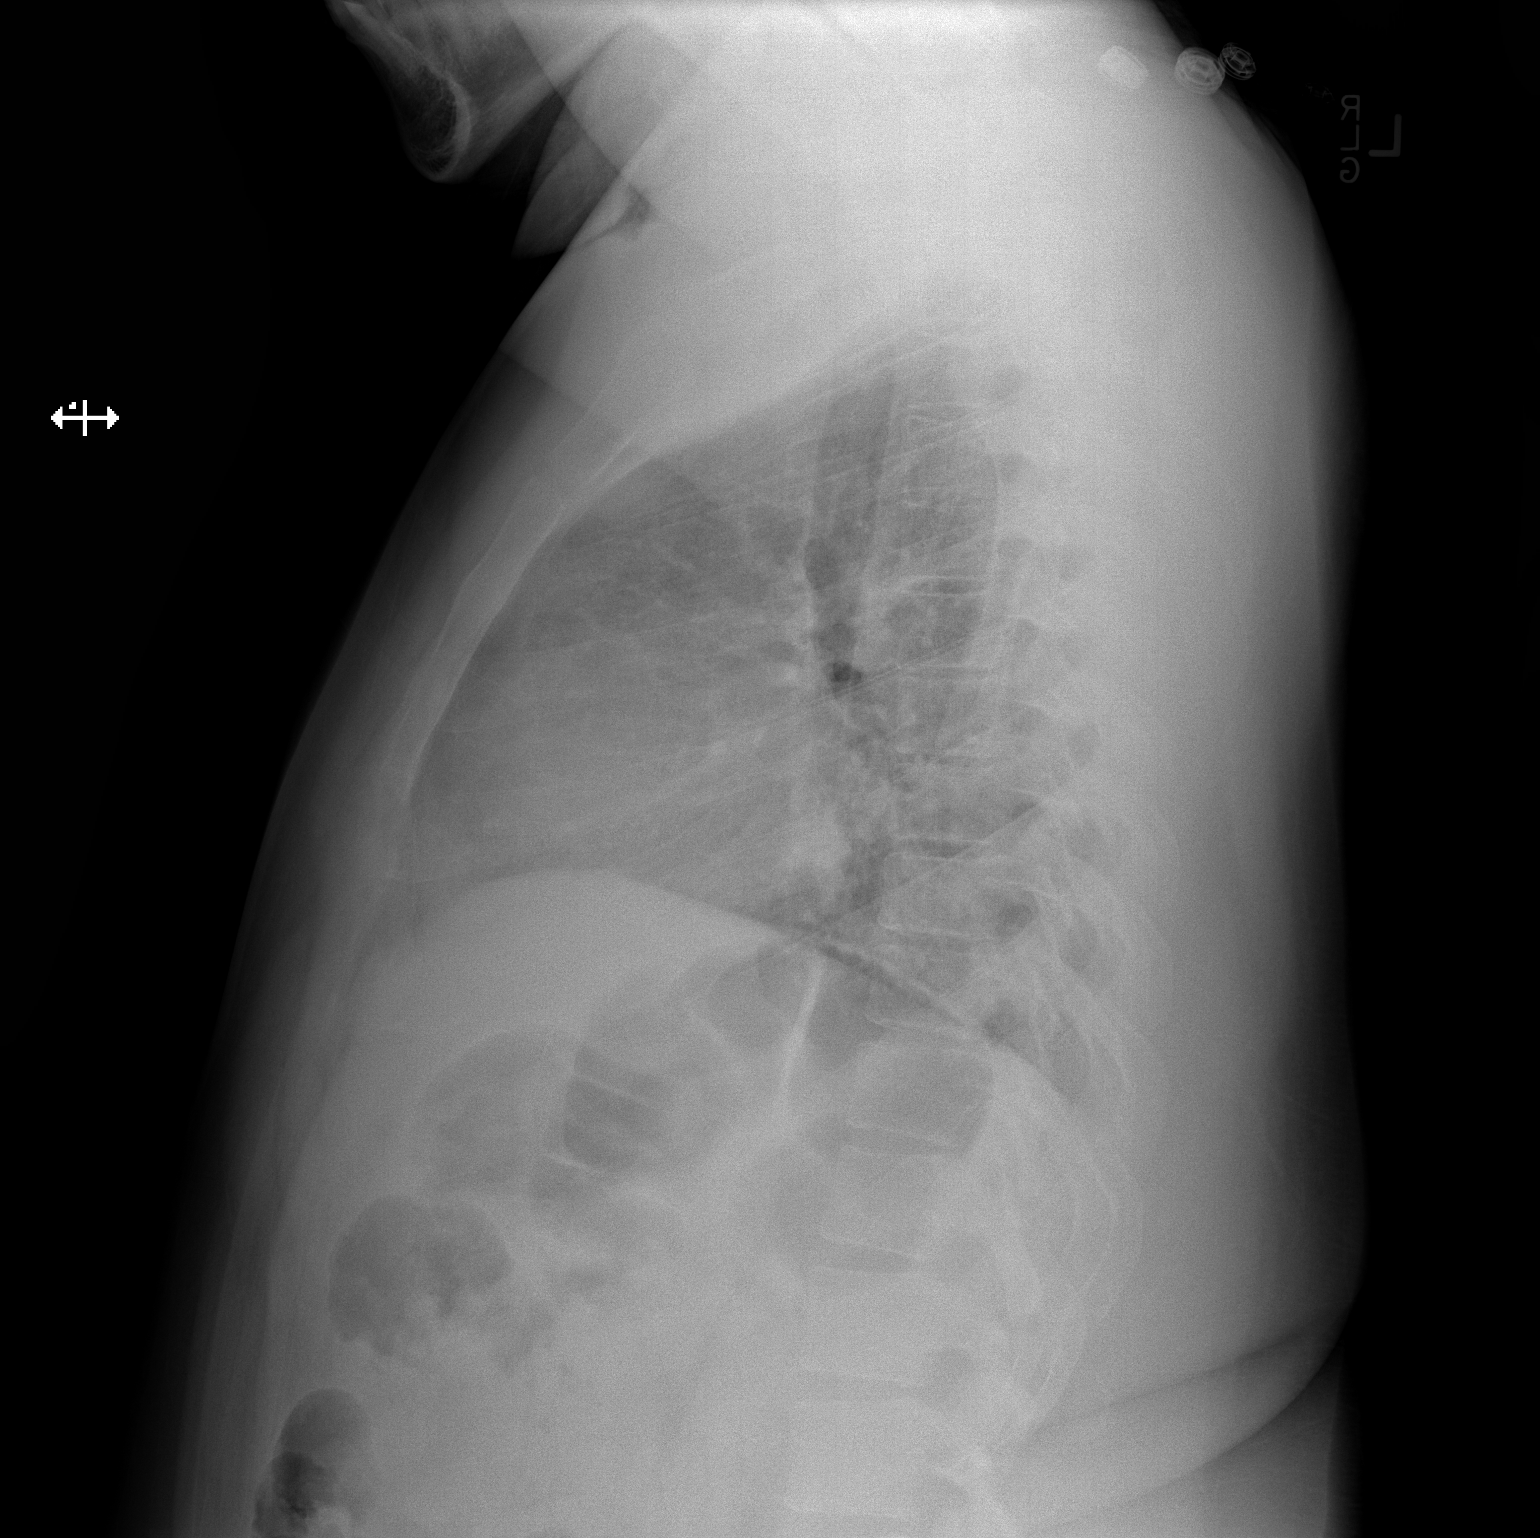

[2 of 2 positions shown; findings below may reference images not displayed]

FINDINGS: No significant osseous abnormality.  Lungs are clear. No
effusion or pneumothorax.  Cardiomediastinal size and contour are
within normal limits.  The upper abdomen is unremarkable.
IMPRESSION: No evidence of acute cardiopulmonary disease.

## 2015-10-14 IMAGING — CR DG CHEST 2V
2 series · 2 of 2 positions shown · non-contrast
Comparison: 06/12/2013

CLINICAL DATA: Nasal congestion with cough.

EXAM:
CHEST  2 VIEW

[w chest pa]
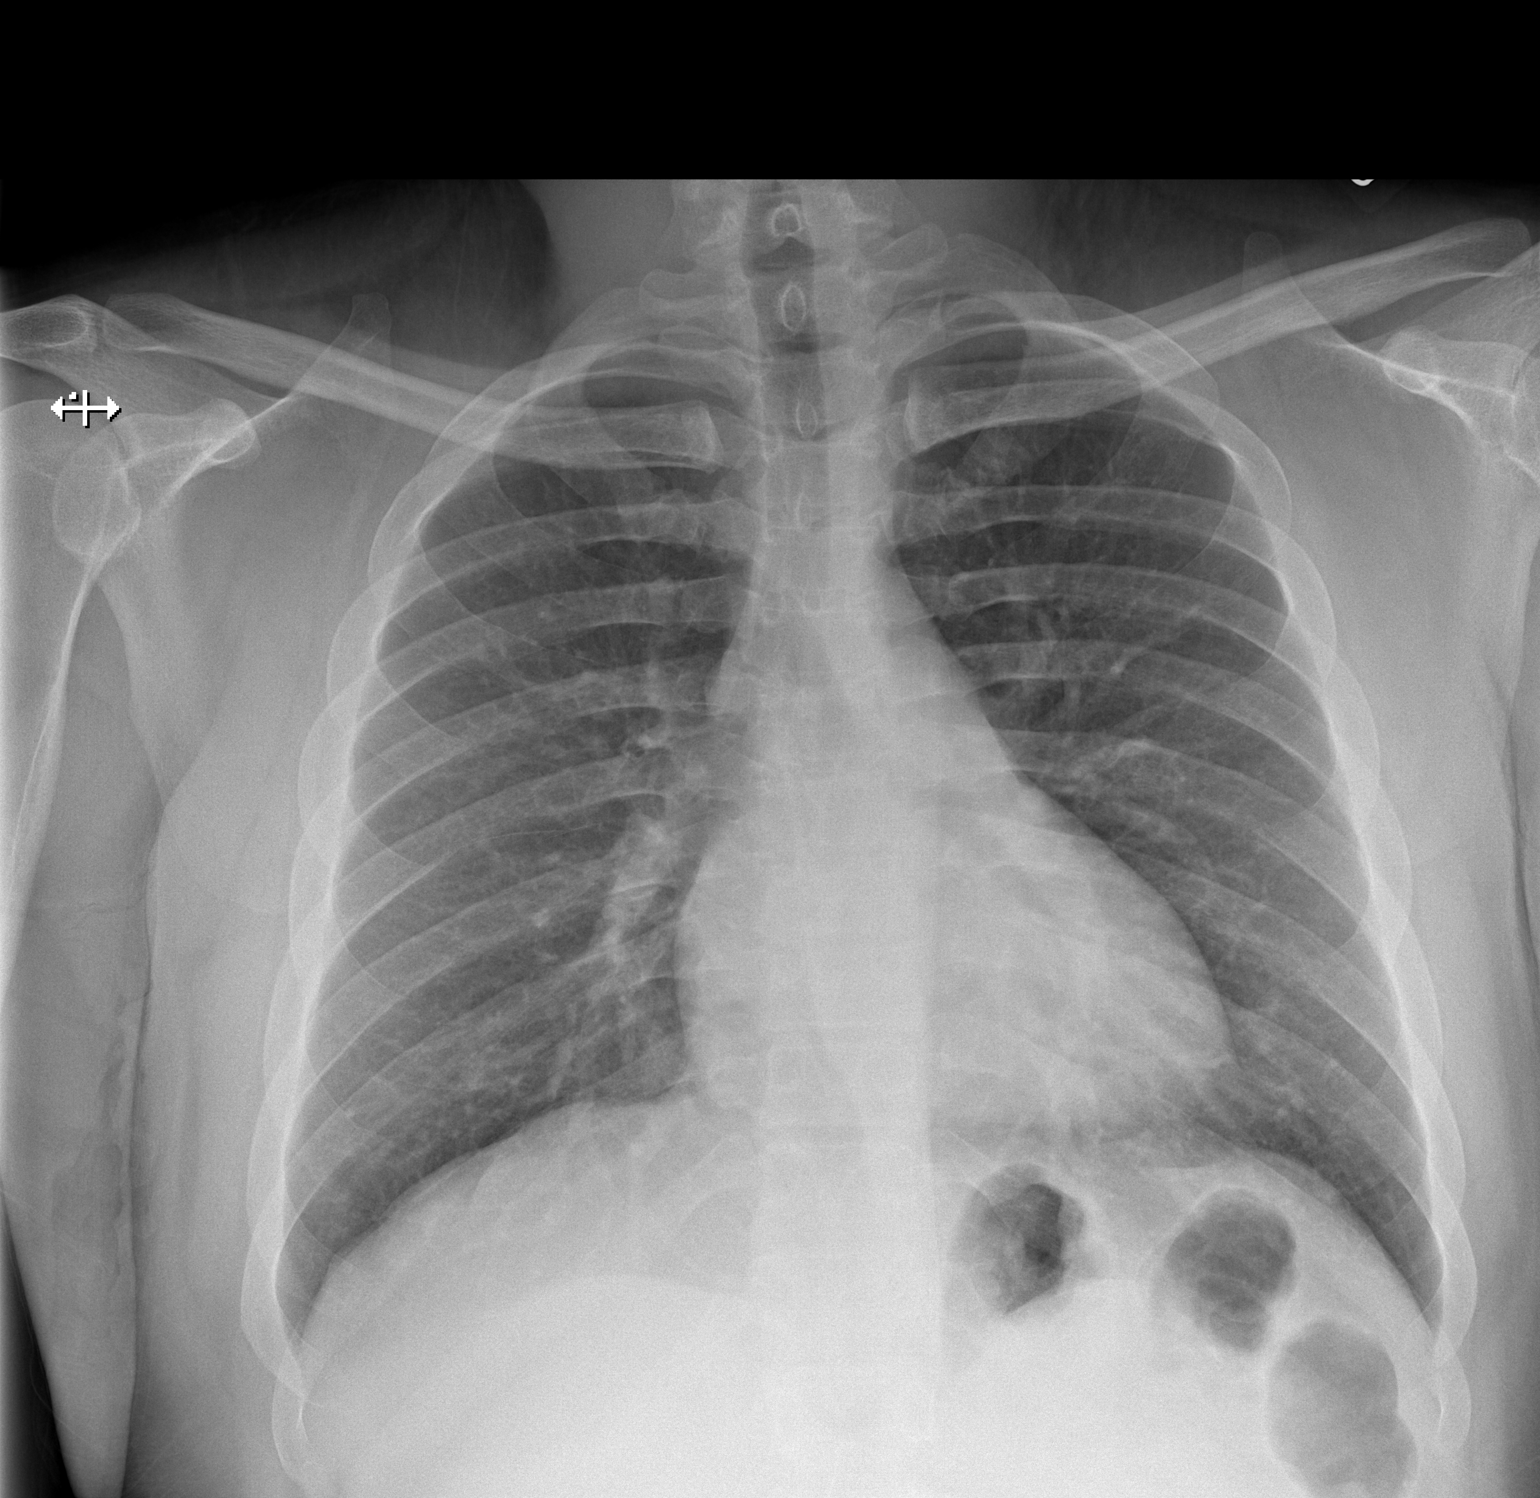

[w chest lat]
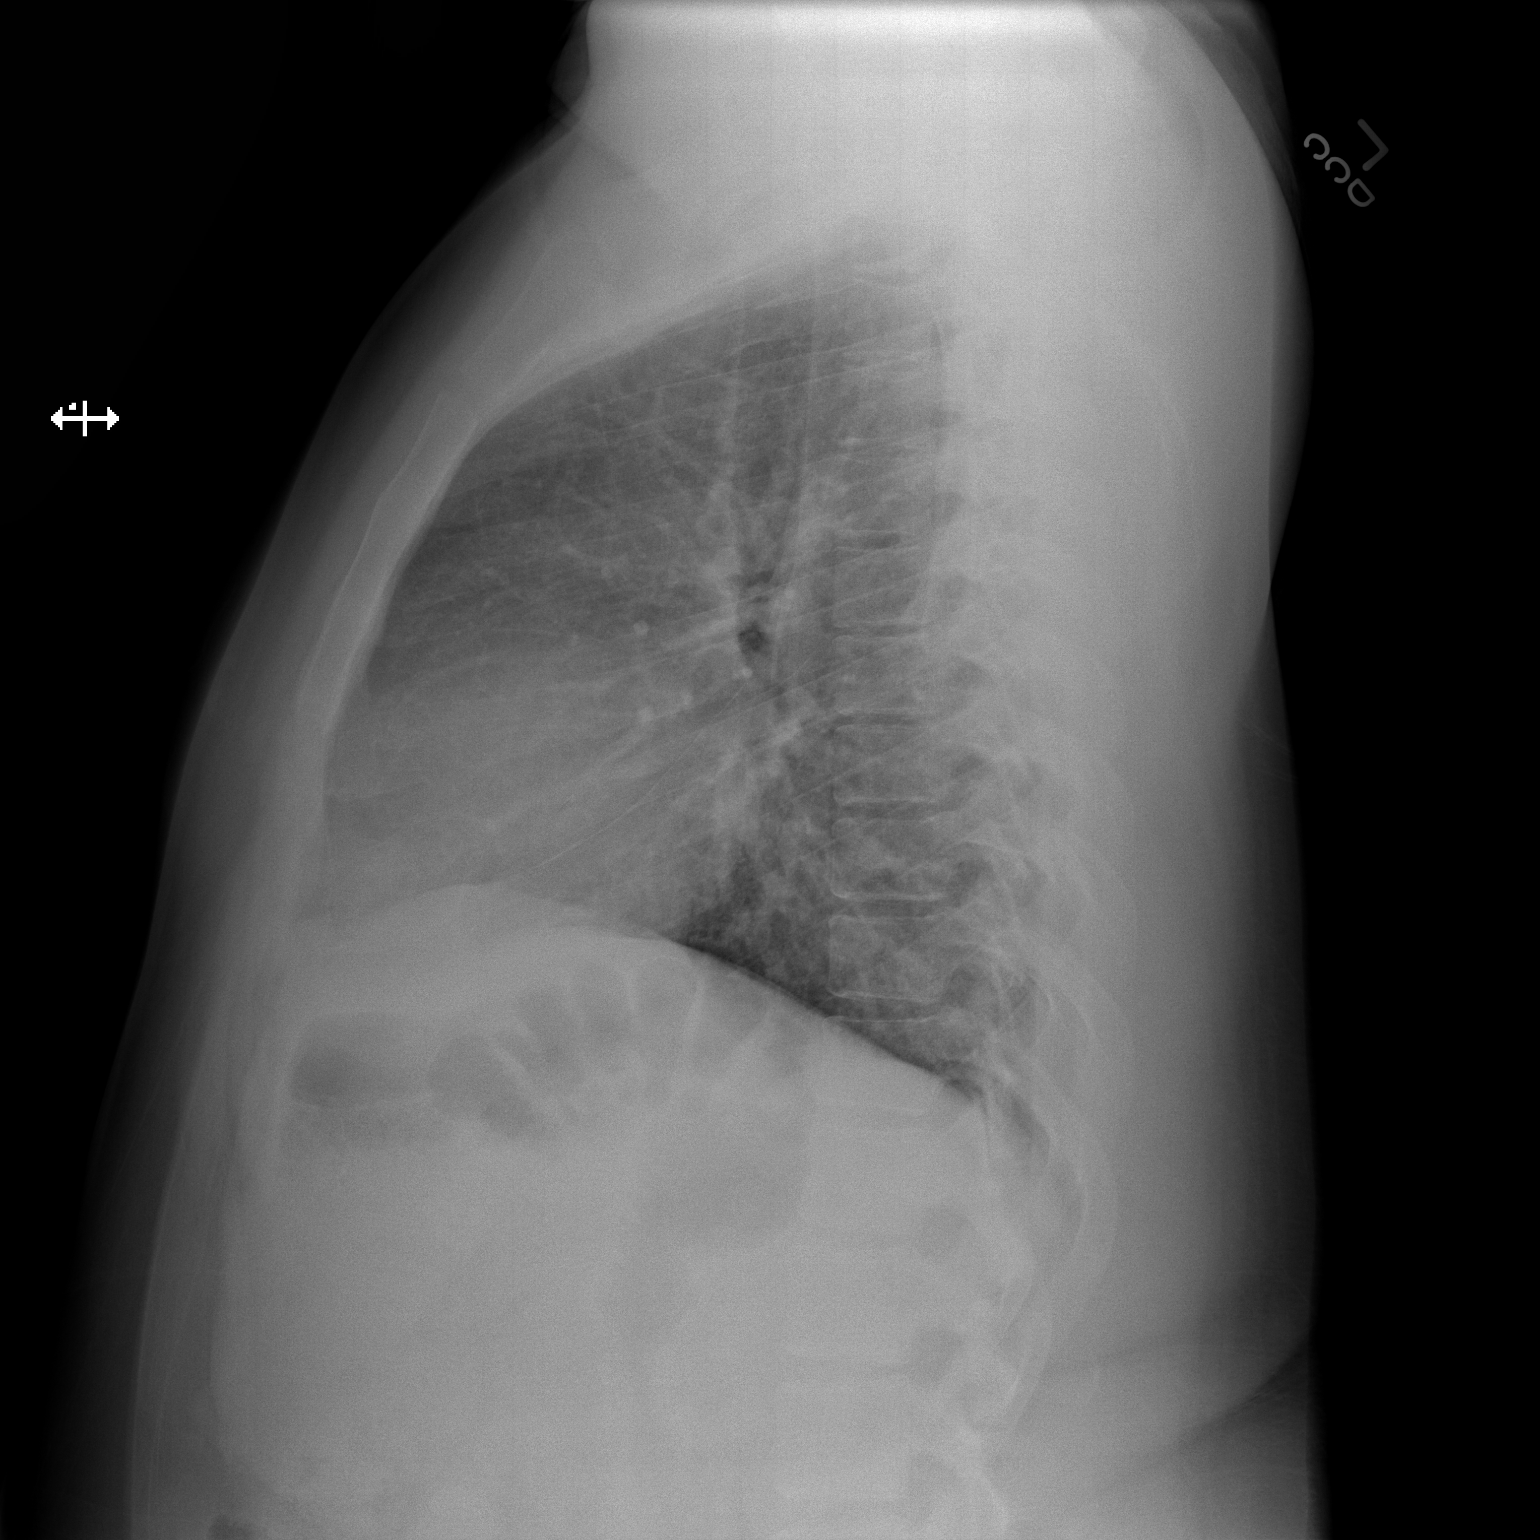

[2 of 2 positions shown; findings below may reference images not displayed]

FINDINGS: The lungs are clear without focal infiltrate, edema, pneumothorax or
pleural effusion. The cardiopericardial silhouette is within normal
limits for size. Imaged bony structures of the thorax are intact.
IMPRESSION: Normal exam.

## 2017-12-24 ENCOUNTER — Encounter (HOSPITAL_COMMUNITY): Payer: Self-pay | Admitting: Family Medicine

## 2017-12-24 ENCOUNTER — Ambulatory Visit (HOSPITAL_COMMUNITY)
Admission: EM | Admit: 2017-12-24 | Discharge: 2017-12-24 | Disposition: A | Discharge status: Home / Self Care | Payer: Self-pay | Attending: Urgent Care | Admitting: Urgent Care

## 2017-12-24 DIAGNOSIS — H5789 Other specified disorders of eye and adnexa: Secondary | ICD-10-CM

## 2017-12-24 DIAGNOSIS — H1031 Unspecified acute conjunctivitis, right eye: Secondary | ICD-10-CM

## 2017-12-24 MED ORDER — ERYTHROMYCIN 5 MG/GM OP OINT: TOPICAL_OINTMENT | Freq: Four times a day (QID) | OPHTHALMIC | 0 refills | Status: DC

## 2017-12-24 NOTE — ED Provider Notes (Signed)
  MRN: [id number] DOB: 09/28/1986  Subjective:   Timothy Levy is a 32 y.o. male presenting for 3 day history of worsening right eye redness, swelling, mild discomfort and drainage. Denies fever, photophobia, trauma, pain with eye movement. Has not tried any medications for relief. Does not wear contacts.  Timothy Levy is not currently taking any medications and has No Known Allergies.  Timothy Levy  has a past medical history of Diabetes mellitus. Denies past surgical history.  Objective:   Vitals: BP 132/68   Pulse 82   Temp 98.7 F (37.1 C) (Oral)   Resp 18   SpO2 99%   Physical Exam  Constitutional: He is oriented to person, place, and time. He appears well-developed and well-nourished.  HENT:  Right Ear: Tympanic membrane normal.  Left Ear: Tympanic membrane normal.  Mouth/Throat: Oropharynx is clear and moist.  Eyes: EOM are normal. Pupils are equal, round, and reactive to light. Right eye exhibits discharge (clear/yellow). Right eye exhibits no chemosis, no exudate and no hordeolum. No foreign body present in the right eye. Right conjunctiva is injected.  Cardiovascular: Normal rate.  Pulmonary/Chest: Effort normal.  Neurological: He is alert and oriented to person, place, and time.   Assessment and Plan :   Acute bacterial conjunctivitis of right eye  Redness of right eye  Will start erythromycin. Return-to-clinic precautions discussed, patient verbalized understanding.    Wallis BambergMani, Timothy Levy, [place]PA-C 12/24/17 2207

## 2017-12-24 NOTE — ED Triage Notes (Addendum)
Pt here for right eye swelling and redness with draining. Worsening since Friday. sts sensation of something in eye.

## 2018-05-16 ENCOUNTER — Encounter (HOSPITAL_COMMUNITY): Payer: Self-pay

## 2018-05-16 ENCOUNTER — Ambulatory Visit (HOSPITAL_COMMUNITY)
Admission: EM | Admit: 2018-05-16 | Discharge: 2018-05-16 | Disposition: A | Discharge status: Home / Self Care | Payer: Self-pay | Attending: Family Medicine | Admitting: Family Medicine

## 2018-05-16 DIAGNOSIS — X509XXA Other and unspecified overexertion or strenuous movements or postures, initial encounter: Secondary | ICD-10-CM

## 2018-05-16 DIAGNOSIS — S39012A Strain of muscle, fascia and tendon of lower back, initial encounter: Secondary | ICD-10-CM

## 2018-05-16 DIAGNOSIS — S29012A Strain of muscle and tendon of back wall of thorax, initial encounter: Secondary | ICD-10-CM

## 2018-05-16 DIAGNOSIS — T148XXA Other injury of unspecified body region, initial encounter: Secondary | ICD-10-CM

## 2018-05-16 MED ORDER — KETOROLAC TROMETHAMINE 60 MG/2ML IM SOLN
INTRAMUSCULAR | Status: AC
Start: 2018-05-16 — End: 2018-05-16
  Filled 2018-05-16: qty 2

## 2018-05-16 MED ORDER — MELOXICAM 7.5 MG PO TABS: 7.5000 mg | ORAL_TABLET | Freq: Every day | ORAL | 0 refills | Status: DC

## 2018-05-16 MED ORDER — KETOROLAC TROMETHAMINE 60 MG/2ML IM SOLN
60.0000 mg | Freq: Once | INTRAMUSCULAR | Status: AC
  Administered 2018-05-16: 60 mg via INTRAMUSCULAR

## 2018-05-16 MED ORDER — CYCLOBENZAPRINE HCL 10 MG PO TABS: 10.0000 mg | ORAL_TABLET | Freq: Two times a day (BID) | ORAL | 0 refills | Status: DC | PRN

## 2018-05-16 NOTE — ED Provider Notes (Signed)
[facility]    CSN: [id number] Arrival date & time: 05/16/18  1405     History   Chief Complaint Chief Complaint  Patient presents with  . Back Pain    HPI Timothy Levy is a 32 y.o. male.   Pt is a 32 year old male with back pain since Sunday. 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 He felt a pulling in the left thoracic and lumbar area. Since he has continued to work and the pain has gotten worse. He has been taking ibuprofen with some relief of the pain. He denies any numbness, tingling, weakness or loss of bowel or bladder function. He has increased pain with bending, standing and certain movement. Denies any fever, chills, body aches, dysuria, hematuria or frequency.   ROS per HPI      Past Medical History:  Diagnosis Date  . Diabetes mellitus    as a teen took oral hypogylcemics; lost significant weight & no longer checks CBGs    There are no active problems to display for this patient.   History reviewed. No pertinent surgical history.     Home Medications    Prior to Admission medications   Medication Sig Start Date End Date Taking? Authorizing Provider  cyclobenzaprine (FLEXERIL) 10 MG tablet Take 1 tablet (10 mg total) by mouth 2 (two) times daily as needed for muscle spasms. 05/16/18   Dahlia ByesBast, Telisa Ohlsen A, NP  erythromycin ophthalmic ointment Place into the right eye 4 (four) times daily. 12/24/17   Wallis BambergMani, Mario, PA-C  meloxicam (MOBIC) 7.5 MG tablet Take 1 tablet (7.5 mg total) by mouth daily. 05/16/18   Janace ArisBast, Wentworth Antolin A, NP    Family History Family History  Problem Relation Age of Onset  . Healthy Mother   . Healthy Father     Social History Social History   Tobacco Use  . Smoking status: Current Every Day Smoker    Types: Cigarettes  . Smokeless tobacco: Never Used  Substance Use Topics  . Alcohol use: No  . Drug use: No     Allergies   Patient has no known allergies.   Review of Systems Review of  Systems   Physical Exam Triage Vital Signs ED Triage Vitals  Enc Vitals Group     BP 05/16/18 1431 114/67     Pulse Rate 05/16/18 1431 87     Resp 05/16/18 1431 20     Temp 05/16/18 1431 98.8 F (37.1 C)     Temp Source 05/16/18 1431 Temporal     SpO2 05/16/18 1431 100 %     Weight --      Height --      Head Circumference --      Peak Flow --      Pain Score 05/16/18 1432 9     Pain Loc --      Pain Edu? --      Excl. in GC? --    No data found.  Updated Vital Signs BP 114/67 (BP Location: Left Arm)   Pulse 87   Temp 98.8 F (37.1 C) (Temporal)   Resp 20   SpO2 100%   Visual Acuity Right Eye Distance:   Left Eye Distance:   Bilateral Distance:    Right Eye Near:   Left Eye Near:    Bilateral Near:     Physical Exam  Constitutional: He is oriented to person, place, and time. He appears well-developed and well-nourished.  HENT:  Head: Normocephalic and atraumatic.  Neck: Normal range of motion.  Pulmonary/Chest: Effort normal.  Musculoskeletal: He exhibits edema and tenderness. He exhibits no deformity.  Very tender to palpation of the left  thoracic and lumbar paravertebral muscles. Pain elicited with ROM exercises. Slight swelling to left lower lumbar area. No erythema, ecchymosis, deformity.   Neurological: He is alert and oriented to person, place, and time. No sensory deficit.  Skin: Skin is warm and dry. Capillary refill takes less than 2 seconds. No rash noted. No erythema.  Psychiatric: He has a normal mood and affect.  Nursing note and vitals reviewed.    UC Treatments / Results  Labs (all labs ordered are listed, but only abnormal results are displayed) Labs Reviewed - No data to display  EKG None  Radiology No results found.  Procedures Procedures (including critical care time)  Medications Ordered in UC Medications  ketorolac (TORADOL) injection 60 mg (60 mg Intramuscular Given 05/16/18 1517)    Initial Impression / Assessment and  Plan / UC Course  I have reviewed the triage vital signs and the nursing notes.  Pertinent labs & imaging results that were available during my care of the patient were reviewed by me and considered in my medical decision making (see chart for details).     Most likely muscle strain/spasm. Will try some muscle relaxant and meloxicam for pain. Heat/Ice and rest. Work note given.  Final Clinical Impressions(s) / UC Diagnoses   Final diagnoses:  Muscle strain     Discharge Instructions     It was nice meeting you!!  I believe that you have pulled a muscle in your back.  We will give you a muscle relaxant and some pain/inflammation mediation to help. Injection of Toradol in clinic.  Rest, ice/heat would help.  Follow up for any worsening or continued symptoms.     ED Prescriptions    Medication Sig Dispense Auth. Provider   cyclobenzaprine (FLEXERIL) 10 MG tablet Take 1 tablet (10 mg total) by mouth 2 (two) times daily as needed for muscle spasms. 20 tablet Sedric Guia A, NP   meloxicam (MOBIC) 7.5 MG tablet Take 1 tablet (7.5 mg total) by mouth daily. 30 tablet Dahlia Byes A, NP     Controlled Substance Prescriptions [place] Controlled Substance Registry consulted? Not Applicable   Janace Aris, NP 05/16/18 [phone]

## 2018-05-16 NOTE — ED Triage Notes (Signed)
Pt presents with back pain on lower left side that radiates up towards middle of back

## 2018-05-16 NOTE — Discharge Instructions (Addendum)
It was nice meeting you!!  I believe that you have pulled a muscle in your back.  We will give you a muscle relaxant and some pain/inflammation mediation to help. Injection of Toradol in clinic.  Rest, ice/heat would help.  Follow up for any worsening or continued symptoms.

## 2018-08-21 ENCOUNTER — Ambulatory Visit (HOSPITAL_COMMUNITY)
Admission: EM | Admit: 2018-08-21 | Discharge: 2018-08-21 | Disposition: A | Discharge status: Home / Self Care | Payer: Self-pay | Attending: Family Medicine | Admitting: Family Medicine

## 2018-08-21 ENCOUNTER — Other Ambulatory Visit: Payer: Self-pay

## 2018-08-21 ENCOUNTER — Encounter (HOSPITAL_COMMUNITY): Payer: Self-pay | Admitting: Emergency Medicine

## 2018-08-21 DIAGNOSIS — R110 Nausea: Secondary | ICD-10-CM

## 2018-08-21 DIAGNOSIS — R509 Fever, unspecified: Secondary | ICD-10-CM

## 2018-08-21 DIAGNOSIS — R6889 Other general symptoms and signs: Secondary | ICD-10-CM

## 2018-08-21 DIAGNOSIS — F17210 Nicotine dependence, cigarettes, uncomplicated: Secondary | ICD-10-CM | POA: Insufficient documentation

## 2018-08-21 DIAGNOSIS — E119 Type 2 diabetes mellitus without complications: Secondary | ICD-10-CM | POA: Insufficient documentation

## 2018-08-21 DIAGNOSIS — J111 Influenza due to unidentified influenza virus with other respiratory manifestations: Secondary | ICD-10-CM | POA: Insufficient documentation

## 2018-08-21 DIAGNOSIS — Z79899 Other long term (current) drug therapy: Secondary | ICD-10-CM | POA: Insufficient documentation

## 2018-08-21 DIAGNOSIS — M791 Myalgia, unspecified site: Secondary | ICD-10-CM

## 2018-08-21 DIAGNOSIS — R51 Headache: Secondary | ICD-10-CM

## 2018-08-21 LAB — POCT RAPID STREP A: Streptococcus, Group A Screen (Direct): NEGATIVE

## 2018-08-21 MED ORDER — IBUPROFEN 800 MG PO TABS: 800.0000 mg | ORAL_TABLET | Freq: Three times a day (TID) | ORAL | 0 refills | Status: DC

## 2018-08-21 MED ORDER — ACETAMINOPHEN 325 MG PO TABS
ORAL_TABLET | ORAL | Status: AC
  Filled 2018-08-21: qty 2

## 2018-08-21 MED ORDER — ONDANSETRON 4 MG PO TBDP
ORAL_TABLET | ORAL | Status: AC
  Filled 2018-08-21: qty 1

## 2018-08-21 MED ORDER — ONDANSETRON HCL 4 MG PO TABS: 4.0000 mg | ORAL_TABLET | Freq: Three times a day (TID) | ORAL | 0 refills | Status: DC | PRN

## 2018-08-21 MED ORDER — CIPROFLOXACIN HCL 500 MG PO TABS: 500.0000 mg | ORAL_TABLET | Freq: Two times a day (BID) | ORAL | 0 refills | Status: DC

## 2018-08-21 MED ORDER — ACETAMINOPHEN ER 650 MG PO TBCR: 650.0000 mg | EXTENDED_RELEASE_TABLET | Freq: Three times a day (TID) | ORAL | 0 refills | Status: DC | PRN

## 2018-08-21 MED ORDER — ACETAMINOPHEN 325 MG PO TABS
650.0000 mg | ORAL_TABLET | Freq: Once | ORAL | Status: AC
  Administered 2018-08-21: 650 mg via ORAL

## 2018-08-21 MED ORDER — ONDANSETRON 4 MG PO TBDP: 4.0000 mg | ORAL_TABLET | Freq: Three times a day (TID) | ORAL | 0 refills | Status: DC | PRN

## 2018-08-21 MED ORDER — ONDANSETRON 4 MG PO TBDP
4.0000 mg | ORAL_TABLET | Freq: Once | ORAL | Status: AC
  Administered 2018-08-21: 4 mg via ORAL

## 2018-08-21 NOTE — ED Triage Notes (Signed)
Pt complains of chills, sweating, body aches, headache and nausea x3 days.  Pt was sent home from work today for a fever of 102.7.  Pt has not taken any OTC medications.

## 2018-08-21 NOTE — ED Provider Notes (Signed)
[facility]    CSN: [id number] Arrival date & time: 08/21/18  1802     History   Chief Complaint Chief Complaint  Patient presents with  . Fever  . Chills  . Generalized Body Aches    HPI Timothy Levy is a 32 y.o. male.   32 year old male comes in for 3 day history of flu like symptoms. Has had chills, sweats, body aches, headache, nausea. Mild rhinorrhea without nasal congestion. Mild cough. Denies sore throat. Tmax 102.7. Nausea without vomiting. No abdominal pain, diarrhea. Has not taken anything for the symptoms. Current every day smoker. Positive sick contact.     Past Medical History:  Diagnosis Date  . Diabetes mellitus    as a teen took oral hypogylcemics; lost significant weight & no longer checks CBGs    There are no active problems to display for this patient.   History reviewed. No pertinent surgical history.     Home Medications    Prior to Admission medications   Medication Sig Start Date End Date Taking? Authorizing Provider  acetaminophen (TYLENOL 8 HOUR) 650 MG CR tablet Take 1 tablet (650 mg total) by mouth every 8 (eight) hours as needed for pain. 08/21/18   Cathie Hoops, [place] V, PA-C  cyclobenzaprine (FLEXERIL) 10 MG tablet Take 1 tablet (10 mg total) by mouth 2 (two) times daily as needed for muscle spasms. 05/16/18   Dahlia Byes A, NP  erythromycin ophthalmic ointment Place into the right eye 4 (four) times daily. 12/24/17   Wallis Bamberg, PA-C  ibuprofen (ADVIL,MOTRIN) 800 MG tablet Take 1 tablet (800 mg total) by mouth 3 (three) times daily. 08/21/18   Cathie Hoops, Simrit Gohlke V, PA-C  meloxicam (MOBIC) 7.5 MG tablet Take 1 tablet (7.5 mg total) by mouth daily. 05/16/18   Bast, Gloris Manchester A, NP  ondansetron (ZOFRAN ODT) 4 MG disintegrating tablet Take 1 tablet (4 mg total) by mouth every 8 (eight) hours as needed for nausea or vomiting. 08/21/18   Belinda Fisher, PA-C    Family History Family History  Problem Relation Age of Onset  . Healthy Mother   . Healthy  Father     Social History Social History   Tobacco Use  . Smoking status: Current Every Day Smoker    Types: Cigarettes  . Smokeless tobacco: Never Used  Substance Use Topics  . Alcohol use: No  . Drug use: No     Allergies   Patient has no known allergies.   Review of Systems Review of Systems  Reason unable to perform ROS: See HPI as above.     Physical Exam Triage Vital Signs ED Triage Vitals  Enc Vitals Group     BP 08/21/18 1840 124/79     Pulse Rate 08/21/18 1840 (!) 115     Resp --      Temp 08/21/18 1840 (!) 102 F (38.9 C)     Temp Source 08/21/18 1840 Oral     SpO2 08/21/18 1840 96 %     Weight --      Height --      Head Circumference --      Peak Flow --      Pain Score 08/21/18 1838 10     Pain Loc --      Pain Edu? --      Excl. in GC? --    No data found.  Updated Vital Signs BP 124/79 (BP Location: Left Arm)   Pulse 95  Temp (!) 102 F (38.9 C) (Oral)   SpO2 98%   Physical Exam  Constitutional: He is oriented to person, place, and time. He appears well-developed and well-nourished. No distress.  Patient looks uncomfortable, but nontoxic  HENT:  Head: Normocephalic and atraumatic.  Right Ear: Tympanic membrane, external ear and ear canal normal. Tympanic membrane is not erythematous and not bulging.  Left Ear: Tympanic membrane, external ear and ear canal normal. Tympanic membrane is not erythematous and not bulging.  Nose: Nose normal. Right sinus exhibits no maxillary sinus tenderness and no frontal sinus tenderness. Left sinus exhibits no maxillary sinus tenderness and no frontal sinus tenderness.  Mouth/Throat: Uvula is midline, oropharynx is clear and moist and mucous membranes are normal.  Eyes: Pupils are equal, round, and reactive to light. Conjunctivae are normal.  Neck: Normal range of motion. Neck supple.  Cardiovascular: Normal rate, regular rhythm and normal heart sounds. Exam reveals no gallop and no friction rub.  No  murmur heard. Pulmonary/Chest: Effort normal and breath sounds normal. No stridor. No respiratory distress. He has no decreased breath sounds. He has no wheezes. He has no rhonchi. He has no rales.  Lymphadenopathy:    He has no cervical adenopathy.  Neurological: He is alert and oriented to person, place, and time.  Skin: Skin is warm and dry.  Psychiatric: He has a normal mood and affect. His behavior is normal. Judgment normal.     UC Treatments / Results  Labs (all labs ordered are listed, but only abnormal results are displayed) Labs Reviewed  CULTURE, GROUP A STREP [facility])  POCT RAPID STREP A    EKG None  Radiology No results found.  Procedures Procedures (including critical care time)  Medications Ordered in UC Medications  acetaminophen (TYLENOL) tablet 650 mg (650 mg Oral Given 08/21/18 1843)  ondansetron (ZOFRAN-ODT) disintegrating tablet 4 mg (4 mg Oral Given 08/21/18 1939)    Initial Impression / Assessment and Plan / UC Course  I have reviewed the triage vital signs and the nursing notes.  Pertinent labs & imaging results that were available during my care of the patient were reviewed by me and considered in my medical decision making (see chart for details).    Rapid strep negative.  Flulike symptoms, outside of treatment period for Tamiflu.  Will provide symptomatic treatment.  Push fluids.  Return precautions given.  Patient expresses understanding and agrees to plan.   Final Clinical Impressions(s) / UC Diagnoses   Final diagnoses:  Flu-like symptoms    ED Prescriptions    Medication Sig Dispense Auth. Provider   ciprofloxacin (CIPRO) 500 MG tablet  (Status: Discontinued) Take 1 tablet (500 mg total) by mouth 2 (two) times daily. 6 tablet Rodriguez-Southworth, Sylvia, PA-C   ondansetron (ZOFRAN) 4 MG tablet  (Status: Discontinued) Take 1 tablet (4 mg total) by mouth every 8 (eight) hours as needed for nausea or vomiting. 20 tablet  Rodriguez-Southworth, Sylvia, PA-C   ondansetron (ZOFRAN ODT) 4 MG disintegrating tablet Take 1 tablet (4 mg total) by mouth every 8 (eight) hours as needed for nausea or vomiting. 20 tablet Chanise Habeck V, PA-C   ibuprofen (ADVIL,MOTRIN) 800 MG tablet Take 1 tablet (800 mg total) by mouth 3 (three) times daily. 21 tablet Eretria Manternach V, PA-C   acetaminophen (TYLENOL 8 HOUR) 650 MG CR tablet Take 1 tablet (650 mg total) by mouth every 8 (eight) hours as needed for pain. 30 tablet Belinda Fisher, PA-C  Belinda FisherYu, Skyleen Bentley V, PA-C 08/21/18 2017

## 2018-08-21 NOTE — Discharge Instructions (Signed)
Rapid strep negative.  Symptoms most consistent with flu.  As discussed, given symptoms has been for 3 days, Tamiflu is no longer indicated.  Take Zofran for nausea/vomiting.  Alternate Tylenol and Motrin for pain and fever. Keep hydrated, your urine should be clear to pale yellow in color. Tylenol/motrin for fever and pain. Monitor for any worsening of symptoms, chest pain, shortness of breath, wheezing, swelling of the throat, follow up for reevaluation.   For sore throat/cough try using a honey-based tea. Use 3 teaspoons of honey with juice squeezed from half lemon. Place shaved pieces of ginger into 1/2-1 cup of water and warm over stove top. Then mix the ingredients and repeat every 4 hours as needed.

## 2018-08-24 LAB — CULTURE, GROUP A STREP (THRC)

## 2022-08-19 ENCOUNTER — Ambulatory Visit (INDEPENDENT_AMBULATORY_CARE_PROVIDER_SITE_OTHER): Payer: Self-pay

## 2022-08-19 ENCOUNTER — Ambulatory Visit (HOSPITAL_COMMUNITY)
Admission: EM | Admit: 2022-08-19 | Discharge: 2022-08-19 | Disposition: A | Discharge status: Home / Self Care | Payer: Self-pay | Attending: Internal Medicine | Admitting: Internal Medicine

## 2022-08-19 ENCOUNTER — Encounter (HOSPITAL_COMMUNITY): Payer: Self-pay | Admitting: *Deleted

## 2022-08-19 DIAGNOSIS — W19XXXA Unspecified fall, initial encounter: Secondary | ICD-10-CM

## 2022-08-19 DIAGNOSIS — M79604 Pain in right leg: Secondary | ICD-10-CM

## 2022-08-19 DIAGNOSIS — M898X5 Other specified disorders of bone, thigh: Secondary | ICD-10-CM

## 2022-08-19 DIAGNOSIS — W133XXA Fall through floor, initial encounter: Secondary | ICD-10-CM

## 2022-08-19 MED ORDER — IBUPROFEN 800 MG PO TABS
ORAL_TABLET | ORAL | Status: AC
  Filled 2022-08-19: qty 1

## 2022-08-19 MED ORDER — IBUPROFEN 800 MG PO TABS: 800.0000 mg | ORAL_TABLET | Freq: Three times a day (TID) | ORAL | 0 refills | Status: DC

## 2022-08-19 MED ORDER — IBUPROFEN 800 MG PO TABS
800.0000 mg | ORAL_TABLET | Freq: Once | ORAL | Status: AC
  Administered 2022-08-19: 800 mg via ORAL

## 2022-08-19 NOTE — Discharge Instructions (Addendum)
Rest, ice, and elevate your right upper leg.  After a couple of days of using ice to reduce inflammation, you may start using heat to relax the muscle.  You may take ibuprofen 800 mg every 8 hours as needed for pain and inflammation.  You may also take Tylenol every 6 hours as needed for pain.  Follow-up with the orthopedic doctor listed on your paperwork as needed if you continue to have pain and swelling to the right thigh.   Follow-up with urgent care as needed.

## 2022-08-19 NOTE — ED Triage Notes (Signed)
Pt fell through the floor at his apartment last night. Right leg went all the way through the wood floor. His right leg is bruises and hurting. He took tylenol last night.

## 2022-08-19 NOTE — ED Provider Notes (Signed)
[facility]    CSN: [id number] Arrival date & time: 08/19/22  1505      History   Chief Complaint Chief Complaint  Patient presents with   Fall    HPI Timothy Levy is a 36 y.o. male.   Patient presents urgent care for evaluation after he fell through his apartment floor with his right leg yesterday.  He states he was walking in his apartment when all of a sudden he fell through his hardwood floors.  He was able to get himself up off of the floor but was at home by himself when the fall happened.  He denies recent falls, and preceding dizziness, chest pain, nausea, vomiting, or shortness of breath prior to falling.  States he suffered some abrasions to the right leg as a result of the fall but these abrasions blood minimally initially and the bleeding is now stopped.  States the right thigh became very swollen and bruised after the injury but he has been able to walk on it without a limp.  He is currently experiencing pain to the right quadricep proximal to the right knee midway up the right thigh.  Pain is currently an 8 on a scale of 0-10 the right thigh.  He has been taking extra strength Tylenol every 6-8 hours as needed for pain and states this has not helped very much.  He has not taken any ibuprofen prior to arrival urgent care.  He did not hit his head and denies numbness or tingling to the bilateral lower extremities.  Last tetanus injection was 2 weeks ago.     Past Medical History:  Diagnosis Date   Diabetes mellitus    as a teen took oral hypogylcemics; lost significant weight & no longer checks CBGs    There are no problems to display for this patient.   History reviewed. No pertinent surgical history.     Home Medications    Prior to Admission medications   Medication Sig Start Date End Date Taking? Authorizing Provider  acetaminophen (TYLENOL 8 HOUR) 650 MG CR tablet Take 1 tablet (650 mg total) by mouth every 8 (eight) hours as needed for  pain. 08/21/18  Yes Yu, Amy V, PA-C  ibuprofen (ADVIL) 800 MG tablet Take 1 tablet (800 mg total) by mouth 3 (three) times daily. 08/19/22  Yes Julicia Krieger, Donavan Burnet, FNP    Family History Family History  Problem Relation Age of Onset   Healthy Mother    Healthy Father     Social History Social History   Tobacco Use   Smoking status: Every Day    Types: Cigarettes   Smokeless tobacco: Never  Vaping Use   Vaping Use: Never used  Substance Use Topics   Alcohol use: No   Drug use: No     Allergies   Patient has no known allergies.   Review of Systems Review of Systems Per HPI  Physical Exam Triage Vital Signs ED Triage Vitals  Enc Vitals Group     BP 08/19/22 1554 (!) 124/103     Pulse Rate 08/19/22 1554 60     Resp 08/19/22 1554 18     Temp 08/19/22 1554 99.2 F (37.3 C)     Temp Source 08/19/22 1554 Oral     SpO2 --      Weight --      Height --      Head Circumference --      Peak Flow --  Pain Score 08/19/22 1553 8     Pain Loc --      Pain Edu? --      Excl. in GC? --    No data found.  Updated Vital Signs BP (!) 124/103 (BP Location: Right Arm)   Pulse 60   Temp 99.2 F (37.3 C) (Oral)   Resp 18   Visual Acuity Right Eye Distance:   Left Eye Distance:   Bilateral Distance:    Right Eye Near:   Left Eye Near:    Bilateral Near:     Physical Exam Vitals and nursing note reviewed.  Constitutional:      Appearance: He is not ill-appearing or toxic-appearing.  HENT:     Head: Normocephalic and atraumatic.     Right Ear: Hearing and external ear normal.     Left Ear: Hearing and external ear normal.     Nose: Nose normal.     Mouth/Throat:     Lips: Pink.  Eyes:     General: Lids are normal. Vision grossly intact. Gaze aligned appropriately.     Extraocular Movements: Extraocular movements intact.     Conjunctiva/sclera: Conjunctivae normal.  Pulmonary:     Effort: Pulmonary effort is normal.  Musculoskeletal:        General:  Normal range of motion.     Cervical back: Neck supple.     Comments: Right lower extremity: Diffuse small abrasions present to the right lower extremity as seen in image below.  There is mild swelling to the right quadricep without evidence of ecchymosis, deformity, or decreased strength.  Range of motion of the right lower extremity at the knee, hip, ankle, and foot are all normal.  Strength is intact with abduction and abduction against resistance.  Capillary refill to the right lower extremity is less than 3 with +2 dorsalis pedis pulse.  Ambulates with a steady gait without limp.  Skin:    General: Skin is warm and dry.     Capillary Refill: Capillary refill takes less than 2 seconds.     Findings: No rash.  Neurological:     General: No focal deficit present.     Mental Status: He is alert and oriented to person, place, and time. Mental status is at baseline.     Cranial Nerves: No dysarthria or facial asymmetry.  Psychiatric:        Mood and Affect: Mood normal.        Speech: Speech normal.        Behavior: Behavior normal.        Thought Content: Thought content normal.        Judgment: Judgment normal.      UC Treatments / Results  Labs (all labs ordered are listed, but only abnormal results are displayed) Labs Reviewed - No data to display  EKG   Radiology DG Femur Min 2 Views Right  Result Date: 08/19/2022 CLINICAL DATA:  Patient fell through the floor of his apartment last night EXAM: RIGHT FEMUR 5 VIEWS COMPARISON:  Right knee radiograph March 26, 2012 FINDINGS: There is no evidence of fracture or other focal bone lesions. Soft tissues are unremarkable. IMPRESSION: Negative. Electronically Signed   By: Jacob Moores M.D.   On: 08/19/2022 16:30    Procedures Procedures (including critical care time)  Medications Ordered in UC Medications  ibuprofen (ADVIL) tablet 800 mg (800 mg Oral Given 08/19/22 1624)    Initial Impression / Assessment and Plan / UC Course  I have reviewed the triage vital signs and the nursing notes.  Pertinent labs & imaging results that were available during my care of the patient were reviewed by me and considered in my medical decision making (see chart for details).   1.  Fall X-rays of right femur are negative for bony abnormality. Suspect patient's pain and symptoms will improve over the next couple of days with rest, ice, compression, and elevation.  Advised NSAID use every 8 hours as needed (ibuprofen 800 mg) for inflammation and pain.  Patient to follow-up with orthopedic provider if needed.  Walking referral to orthopedics given on AVS.  Discussed physical exam and available lab work findings in clinic with patient.  Counseled patient regarding appropriate use of medications and potential side effects for all medications recommended or prescribed today. Discussed red flag signs and symptoms of worsening condition,when to call the PCP office, return to urgent care, and when to seek higher level of care in the emergency department. Patient verbalizes understanding and agreement with plan. All questions answered. Patient discharged in stable condition.   Final Clinical Impressions(s) / UC Diagnoses   Final diagnoses:  Fall, initial encounter  Pain in femur     Discharge Instructions      Rest, ice, and elevate your right upper leg.  After a couple of days of using ice to reduce inflammation, you may start using heat to relax the muscle.  You may take ibuprofen 800 mg every 8 hours as needed for pain and inflammation.  You may also take Tylenol every 6 hours as needed for pain.  Follow-up with the orthopedic doctor listed on your paperwork as needed if you continue to have pain and swelling to the right thigh.   Follow-up with urgent care as needed.     ED Prescriptions     Medication Sig Dispense Auth. Provider   ibuprofen (ADVIL) 800 MG tablet Take 1 tablet (800 mg total) by mouth 3 (three) times daily.  21 tablet Carlisle Beers, FNP      PDMP not reviewed this encounter.   Carlisle Beers, [place] 08/19/22 [phone]

## 2023-03-13 ENCOUNTER — Ambulatory Visit: Payer: Self-pay

## 2023-03-13 ENCOUNTER — Encounter (HOSPITAL_COMMUNITY): Payer: Self-pay

## 2023-03-13 ENCOUNTER — Ambulatory Visit (HOSPITAL_COMMUNITY)
Admission: EM | Admit: 2023-03-13 | Discharge: 2023-03-13 | Disposition: A | Discharge status: Home / Self Care | Payer: Self-pay | Attending: Family Medicine | Admitting: Family Medicine

## 2023-03-13 DIAGNOSIS — R220 Localized swelling, mass and lump, head: Secondary | ICD-10-CM

## 2023-03-13 DIAGNOSIS — K0889 Other specified disorders of teeth and supporting structures: Secondary | ICD-10-CM

## 2023-03-13 DIAGNOSIS — K047 Periapical abscess without sinus: Secondary | ICD-10-CM

## 2023-03-13 MED ORDER — AMOXICILLIN 875 MG PO TABS: 875.0000 mg | ORAL_TABLET | Freq: Two times a day (BID) | ORAL | 0 refills | Status: DC

## 2023-03-13 MED ORDER — LIDOCAINE VISCOUS HCL 2 % MT SOLN: 15.0000 mL | OROMUCOSAL | 0 refills | Status: DC | PRN

## 2023-03-13 NOTE — ED Triage Notes (Signed)
Pt c/o lt upper toothache with swelling off and on for 2 months and worse in last 5 days. States taking OTC meds with no relief.

## 2023-03-13 NOTE — ED Provider Notes (Signed)
[facility]    CSN: [id number] Arrival date & time: 03/13/23  4098      History   Chief Complaint Chief Complaint  Patient presents with   Dental Pain    HPI Timothy Levy is a 37 y.o. male.   HPI Patient here today with a complaint of dental pain.  Has significant swelling of the gums on the upper left bridge of his mouth.  He has had intermittent left-sided facial swelling.  Endorses that he is overdue for a dental evaluation and likely needs to have his upper left posterior molar tooth extracted.  He has been taking over-the-counter analgesics without relief of pain.  Denies any fever, nausea or vomiting. Past Medical History:  Diagnosis Date   Diabetes mellitus    as a teen took oral hypogylcemics; lost significant weight & no longer checks CBGs    There are no problems to display for this patient.   History reviewed. No pertinent surgical history.     Home Medications    Prior to Admission medications   Medication Sig Start Date End Date Taking? Authorizing Provider  amoxicillin (AMOXIL) 875 MG tablet Take 1 tablet (875 mg total) by mouth 2 (two) times daily. 03/13/23  Yes Bing Neighbors, NP  lidocaine (XYLOCAINE) 2 % solution Use as directed 15 mLs in the mouth or throat every 2 (two) hours as needed for mouth pain (Gargle swish and spit as needed for dental pain). 03/13/23  Yes Bing Neighbors, NP  acetaminophen (TYLENOL 8 HOUR) 650 MG CR tablet Take 1 tablet (650 mg total) by mouth every 8 (eight) hours as needed for pain. 08/21/18   Cathie Hoops, Amy V, PA-C  ibuprofen (ADVIL) 800 MG tablet Take 1 tablet (800 mg total) by mouth 3 (three) times daily. 08/19/22   Carlisle Beers, FNP    Family History Family History  Problem Relation Age of Onset   Healthy Mother    Healthy Father     Social History Social History   Tobacco Use   Smoking status: Every Day    Types: Cigarettes   Smokeless tobacco: Never  Vaping Use   Vaping Use: Never  used  Substance Use Topics   Alcohol use: No   Drug use: No     Allergies   Patient has no known allergies.   Review of Systems Review of Systems Pertinent negatives listed in HPI   Physical Exam Triage Vital Signs ED Triage Vitals [03/13/23 1010]  Enc Vitals Group     BP (!) 147/73     Pulse Rate 62     Resp 18     Temp 98.8 F (37.1 C)     Temp Source Oral     SpO2 95 %     Weight      Height      Head Circumference      Peak Flow      Pain Score 10     Pain Loc      Pain Edu?      Excl. in GC?    No data found.  Updated Vital Signs BP (!) 147/73 (BP Location: Left Arm)   Pulse 62   Temp 98.8 F (37.1 C) (Oral)   Resp 18   SpO2 95%   Visual Acuity Right Eye Distance:   Left Eye Distance:   Bilateral Distance:    Right Eye Near:   Left Eye Near:    Bilateral Near:  Physical Exam Constitutional:      Appearance: Normal appearance.  HENT:     Head: Normocephalic and atraumatic.      Mouth/Throat:     Dentition: Dental tenderness, gingival swelling and dental caries present.  Eyes:     Extraocular Movements: Extraocular movements intact.     Pupils: Pupils are equal, round, and reactive to light.  Cardiovascular:     Rate and Rhythm: Normal rate and regular rhythm.  Pulmonary:     Effort: Pulmonary effort is normal.     Breath sounds: Normal breath sounds.  Neurological:     General: No focal deficit present.     Mental Status: He is alert.      UC Treatments / Results  Labs (all labs ordered are listed, but only abnormal results are displayed) Labs Reviewed - No data to display  EKG   Radiology No results found.  Procedures Procedures (including critical care time)  Medications Ordered in UC Medications - No data to display  Initial Impression / Assessment and Plan / UC Course  I have reviewed the triage vital signs and the nursing notes.  Pertinent labs & imaging results that were available during my care of the  patient were reviewed by me and considered in my medical decision making (see chart for details).    Low suspicion for periorbital cellulitis patient has some localized swelling at the mandible region.  He has significant gingival swelling, erythema along with dental caries.  Will start amoxicillin 875 twice daily for 10 days.  Lidocaine viscous every 2 hours as needed for pain.  Encouraged patient to follow-up with the dental provider listed on discharge paperwork for further workup and evaluation. Final Clinical Impressions(s) / UC Diagnoses   Final diagnoses:  Dental infection  Dentalgia  Gingival swelling   Discharge Instructions   None    ED Prescriptions     Medication Sig Dispense Auth. Provider   amoxicillin (AMOXIL) 875 MG tablet Take 1 tablet (875 mg total) by mouth 2 (two) times daily. 20 tablet Bing Neighbors, NP   lidocaine (XYLOCAINE) 2 % solution Use as directed 15 mLs in the mouth or throat every 2 (two) hours as needed for mouth pain (Gargle swish and spit as needed for dental pain). 100 mL Bing Neighbors, NP      PDMP not reviewed this encounter.   Bing Neighbors, NP 03/13/23 1143

## 2024-04-05 ENCOUNTER — Ambulatory Visit (HOSPITAL_COMMUNITY): Payer: Self-pay

## 2024-07-15 ENCOUNTER — Ambulatory Visit (HOSPITAL_COMMUNITY)
Admission: EM | Admit: 2024-07-15 | Discharge: 2024-07-15 | Disposition: A | Discharge status: Home / Self Care | Payer: Self-pay | Attending: Emergency Medicine | Admitting: Emergency Medicine

## 2024-07-15 ENCOUNTER — Encounter (HOSPITAL_COMMUNITY): Payer: Self-pay

## 2024-07-15 DIAGNOSIS — K0889 Other specified disorders of teeth and supporting structures: Secondary | ICD-10-CM

## 2024-07-15 DIAGNOSIS — M545 Low back pain, unspecified: Secondary | ICD-10-CM

## 2024-07-15 MED ORDER — KETOROLAC TROMETHAMINE 30 MG/ML IJ SOLN
30.0000 mg | Freq: Once | INTRAMUSCULAR | Status: AC
  Administered 2024-07-15: 30 mg via INTRAMUSCULAR

## 2024-07-15 MED ORDER — KETOROLAC TROMETHAMINE 30 MG/ML IJ SOLN
INTRAMUSCULAR | Status: AC
  Filled 2024-07-15: qty 1

## 2024-07-15 MED ORDER — AMOXICILLIN-POT CLAVULANATE 875-125 MG PO TABS: 1.0000 | ORAL_TABLET | Freq: Two times a day (BID) | ORAL | 0 refills | Status: AC

## 2024-07-15 MED ORDER — KETOROLAC TROMETHAMINE 10 MG PO TABS: 10.0000 mg | ORAL_TABLET | Freq: Four times a day (QID) | ORAL | 0 refills | Status: AC | PRN

## 2024-07-15 NOTE — ED Triage Notes (Signed)
 Pt c/o rt lower toothache with swelling to mouth for 2wks. C/o lt lower back pain x2wk. States woke up today with a fever. States will need a work note.

## 2024-07-15 NOTE — ED Provider Notes (Signed)
 [facility]    CSN: [id number] Arrival date & time: 07/15/24  0945      History   Chief Complaint Chief Complaint  Patient presents with   Dental Pain   Back Pain    HPI Timothy Levy is a 38 y.o. male.   Patient presents with right lower dental pain for about 2 weeks.  Patient states that they also noticed some swelling to this area over the last 2 weeks as well.  Patient denies any drainage from this area.  Patient denies any fever, body aches, or chills.  Patient denies having a dentist at this time.  Patient also reports left lower back pain that began about 2 weeks ago.  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  Patient denies any recent falls or known injuries.  Patient states that they have had back pain in the past, but this has been more severe and has progressively worsened over the last 2 weeks.  Patient denies numbness, tingling, or weakness.  Patient also denies dysuria, hematuria, and urinary frequency.  Patient denies taking medication for pain.  The history is provided by the patient and medical records.  Dental Pain Back Pain   Past Medical History:  Diagnosis Date   Diabetes mellitus    as a teen took oral hypogylcemics; lost significant weight & no longer checks CBGs    There are no active problems to display for this patient.   History reviewed. No pertinent surgical history.     Home Medications    Prior to Admission medications   Medication Sig Start Date End Date Taking? Authorizing Provider  amoxicillin -clavulanate (AUGMENTIN ) 875-125 MG tablet Take 1 tablet by mouth every 12 (twelve) hours. 07/15/24  Yes [name] A, NP  ketorolac  (TORADOL ) 10 MG tablet Take 1 tablet (10 mg total) by mouth every 6 (six) hours as needed for moderate pain (pain score 4-6) or severe pain (pain score 7-10). 07/15/24  Yes [name] LABOR, NP    Family History Family History  Problem Relation Age of Onset   Healthy Mother    Healthy Father     Social History Social History   Tobacco Use   Smoking status: Every Day    Types: Cigarettes   Smokeless tobacco: Never  Vaping Use   Vaping status: Never Used  Substance Use Topics   Alcohol use: No   Drug use: Yes    Types: Marijuana     Allergies   Patient has no known allergies.   Review of Systems Review of Systems  Musculoskeletal:  Positive for back pain.   Per HPI  Physical Exam Triage Vital Signs ED Triage Vitals  Encounter Vitals Group     BP 07/15/24 1110 (!) 146/83     Girls Systolic BP Percentile --      Girls Diastolic BP Percentile --      Boys Systolic BP Percentile --      Boys Diastolic BP Percentile --      Pulse Rate 07/15/24 1110 63     Resp 07/15/24 1110 18     Temp 07/15/24 1110 98.2 F (36.8 C)     Temp Source 07/15/24 1110 Oral     SpO2 07/15/24 1110 98 %     Weight --      Height --      Head Circumference --  Peak Flow --      Pain Score 07/15/24 1109 9     Pain Loc --      Pain Education --      Exclude from Growth Chart --    No data found.  Updated Vital Signs BP (!) 146/83 (BP Location: Right Arm)   Pulse 63   Temp 98.2 F (36.8 C) (Oral)   Resp 18   SpO2 98%   Visual Acuity Right Eye Distance:   Left Eye Distance:   Bilateral Distance:    Right Eye Near:   Left Eye Near:    Bilateral Near:     Physical Exam Vitals and nursing note reviewed.  Constitutional:      General: He is awake. He is not in acute distress.    Appearance: Normal appearance. He is well-developed and well-groomed. He is not ill-appearing.  HENT:     Mouth/Throat:     Dentition: Abnormal dentition. Dental tenderness and gingival swelling present.      Comments: Dental tenderness noted to teeth of right lower mouth with surrounding gingival swelling without obvious dental abscess Musculoskeletal:     Cervical back:  Normal.     Thoracic back: Normal.     Lumbar back: Tenderness present. No swelling, edema, deformity, signs of trauma or bony tenderness. Normal range of motion. Negative right straight leg raise test and negative left straight leg raise test.       Back:     Comments: Tenderness noted to left low back without spinous process tenderness.  Skin:    General: Skin is warm and dry.  Neurological:     Mental Status: He is alert.  Psychiatric:        Behavior: Behavior is cooperative.      UC Treatments / Results  Labs (all labs ordered are listed, but only abnormal results are displayed) Labs Reviewed - No data to display  EKG   Radiology No results found.  Procedures Procedures (including critical care time)  Medications Ordered in UC Medications  ketorolac  (TORADOL ) 30 MG/ML injection 30 mg (has no administration in time range)    Initial Impression / Assessment and Plan / UC Course  I have reviewed the triage vital signs and the nursing notes.  Pertinent labs & imaging results that were available during my care of the patient were reviewed by me and considered in my medical decision making (see chart for details).     Patient is overall well-appearing.  Vitals are stable.  Prescribed Augmentin  for dental infection coverage.  Back pain likely muscular in nature.  Given IM Toradol  in clinic for acute pain.  Prescribed additional Toradol  as needed for pain.  Recommended alternate this with Tylenol .  Given dental resources.  Discussed follow-up and return precautions. Final Clinical Impressions(s) / UC Diagnoses   Final diagnoses:  Pain, dental  Acute left-sided low back pain without sciatica     Discharge Instructions      Start taking Augmentin  twice daily for 7 days for dental infection coverage. You were given an injection of Toradol  in clinic today to help with your dental pain and back pain.  Wait at least 8 hours after receiving this injection to take any  additional Toradol .  After this you can alternate between Toradol  and 500 to 1000 mg of Tylenol  every 6-8 hours as needed for pain.  Do not take Toradol  with ibuprofen  or naproxen . I have attached a list of dental resources that you can follow-up  with for further evaluation of your dental pain. Otherwise follow-up with your primary care provider or return here as needed.   ED Prescriptions     Medication Sig Dispense Auth. Provider   amoxicillin -clavulanate (AUGMENTIN ) 875-125 MG tablet Take 1 tablet by mouth every 12 (twelve) hours. 14 tablet [name] A, NP   ketorolac  (TORADOL ) 10 MG tablet Take 1 tablet (10 mg total) by mouth every 6 (six) hours as needed for moderate pain (pain score 4-6) or severe pain (pain score 7-10). 20 tablet [name] A, NP      PDMP not reviewed this encounter.   [name] A, NP 07/15/24 1130

## 2024-07-15 NOTE — Discharge Instructions (Signed)
 Start taking Augmentin  twice daily for 7 days for dental infection coverage. You were given an injection of Toradol  in clinic today to help with your dental pain and back pain.  Wait at least 8 hours after receiving this injection to take any additional Toradol .  After this you can alternate between Toradol  and 500 to 1000 mg of Tylenol  every 6-8 hours as needed for pain.  Do not take Toradol  with ibuprofen  or naproxen . I have attached a list of dental resources that you can follow-up with for further evaluation of your dental pain. Otherwise follow-up with your primary care provider or return here as needed.
